# Patient Record
Sex: Female | Born: 1945
Health system: Southern US, Community
[De-identification: ages and names within clinical notes are randomized; demographics above are authoritative.]

## PROBLEM LIST (undated history)

## (undated) DIAGNOSIS — E7800 Pure hypercholesterolemia, unspecified: Secondary | ICD-10-CM

## (undated) DIAGNOSIS — I10 Essential (primary) hypertension: Secondary | ICD-10-CM

## (undated) DIAGNOSIS — I4891 Unspecified atrial fibrillation: Principal | ICD-10-CM

## (undated) DIAGNOSIS — I219 Acute myocardial infarction, unspecified: Secondary | ICD-10-CM

## (undated) DIAGNOSIS — Z7901 Long term (current) use of anticoagulants: Secondary | ICD-10-CM

## (undated) HISTORY — DX: Unspecified atrial fibrillation: I48.91

## (undated) HISTORY — PX: ECTOPIC PREGNANCY SURGERY: SHX613

## (undated) HISTORY — DX: Long term (current) use of anticoagulants: Z79.01

---

## 1997-03-16 ENCOUNTER — Encounter (INDEPENDENT_AMBULATORY_CARE_PROVIDER_SITE_OTHER): Payer: Self-pay | Admitting: *Deleted

## 1997-03-16 LAB — CONVERTED CEMR LAB

## 1997-05-19 ENCOUNTER — Encounter: Admission: RE | Admit: 1997-05-19 | Discharge: 1997-05-19 | Payer: Self-pay | Admitting: Obstetrics & Gynecology

## 1997-08-14 ENCOUNTER — Inpatient Hospital Stay (HOSPITAL_COMMUNITY): Admission: EM | Admit: 1997-08-14 | Discharge: 1997-08-19 | Payer: Self-pay | Admitting: Emergency Medicine

## 1997-09-15 ENCOUNTER — Encounter: Admission: RE | Admit: 1997-09-15 | Discharge: 1997-09-15 | Payer: Self-pay | Admitting: Family Medicine

## 1998-03-25 ENCOUNTER — Inpatient Hospital Stay (HOSPITAL_COMMUNITY): Admission: EM | Admit: 1998-03-25 | Discharge: 1998-03-26 | Payer: Self-pay | Admitting: Emergency Medicine

## 1998-03-25 ENCOUNTER — Encounter: Payer: Self-pay | Admitting: Emergency Medicine

## 1998-03-26 ENCOUNTER — Encounter: Payer: Self-pay | Admitting: Cardiology

## 1998-11-25 ENCOUNTER — Encounter: Admission: RE | Admit: 1998-11-25 | Discharge: 1998-11-25 | Payer: Self-pay | Admitting: Sports Medicine

## 1998-12-13 ENCOUNTER — Emergency Department (HOSPITAL_COMMUNITY): Admission: EM | Admit: 1998-12-13 | Discharge: 1998-12-13 | Payer: Self-pay | Admitting: Emergency Medicine

## 1998-12-13 ENCOUNTER — Encounter: Payer: Self-pay | Admitting: Emergency Medicine

## 1998-12-28 ENCOUNTER — Encounter: Admission: RE | Admit: 1998-12-28 | Discharge: 1998-12-28 | Payer: Self-pay | Admitting: Sports Medicine

## 1999-02-03 ENCOUNTER — Encounter: Admission: RE | Admit: 1999-02-03 | Discharge: 1999-02-03 | Payer: Self-pay | Admitting: Sports Medicine

## 2000-01-03 ENCOUNTER — Encounter: Admission: RE | Admit: 2000-01-03 | Discharge: 2000-01-03 | Payer: Self-pay | Admitting: Obstetrics & Gynecology

## 2000-01-12 ENCOUNTER — Encounter: Admission: RE | Admit: 2000-01-12 | Discharge: 2000-01-12 | Payer: Self-pay | Admitting: Sports Medicine

## 2000-01-17 ENCOUNTER — Encounter: Payer: Self-pay | Admitting: Obstetrics & Gynecology

## 2000-01-17 ENCOUNTER — Ambulatory Visit (HOSPITAL_COMMUNITY): Admission: RE | Admit: 2000-01-17 | Discharge: 2000-01-17 | Payer: Self-pay | Admitting: Obstetrics & Gynecology

## 2000-08-20 ENCOUNTER — Observation Stay (HOSPITAL_COMMUNITY): Admission: EM | Admit: 2000-08-20 | Discharge: 2000-08-21 | Payer: Self-pay | Admitting: Emergency Medicine

## 2000-08-20 ENCOUNTER — Encounter: Payer: Self-pay | Admitting: Emergency Medicine

## 2001-04-18 ENCOUNTER — Encounter: Admission: RE | Admit: 2001-04-18 | Discharge: 2001-04-18 | Payer: Self-pay | Admitting: Sports Medicine

## 2001-04-22 ENCOUNTER — Encounter: Admission: RE | Admit: 2001-04-22 | Discharge: 2001-04-22 | Payer: Self-pay | Admitting: Sports Medicine

## 2001-04-22 ENCOUNTER — Encounter: Payer: Self-pay | Admitting: Sports Medicine

## 2002-08-21 ENCOUNTER — Encounter: Payer: Self-pay | Admitting: Sports Medicine

## 2002-08-21 ENCOUNTER — Encounter: Admission: RE | Admit: 2002-08-21 | Discharge: 2002-08-21 | Payer: Self-pay | Admitting: Family Medicine

## 2002-09-22 ENCOUNTER — Encounter: Admission: RE | Admit: 2002-09-22 | Discharge: 2002-09-22 | Payer: Self-pay | Admitting: Family Medicine

## 2002-09-30 ENCOUNTER — Encounter: Payer: Self-pay | Admitting: Obstetrics & Gynecology

## 2002-09-30 ENCOUNTER — Ambulatory Visit (HOSPITAL_COMMUNITY): Admission: RE | Admit: 2002-09-30 | Discharge: 2002-09-30 | Payer: Self-pay | Admitting: Obstetrics & Gynecology

## 2003-11-19 ENCOUNTER — Ambulatory Visit: Payer: Self-pay | Admitting: Sports Medicine

## 2005-01-12 ENCOUNTER — Ambulatory Visit: Payer: Self-pay | Admitting: Sports Medicine

## 2005-05-25 ENCOUNTER — Encounter: Payer: Self-pay | Admitting: Sports Medicine

## 2006-04-12 DIAGNOSIS — E782 Mixed hyperlipidemia: Secondary | ICD-10-CM | POA: Insufficient documentation

## 2006-04-12 DIAGNOSIS — D509 Iron deficiency anemia, unspecified: Secondary | ICD-10-CM | POA: Insufficient documentation

## 2006-04-12 DIAGNOSIS — I2510 Atherosclerotic heart disease of native coronary artery without angina pectoris: Secondary | ICD-10-CM | POA: Insufficient documentation

## 2006-04-12 DIAGNOSIS — K2100 Gastro-esophageal reflux disease with esophagitis, without bleeding: Secondary | ICD-10-CM | POA: Insufficient documentation

## 2006-04-12 DIAGNOSIS — I10 Essential (primary) hypertension: Secondary | ICD-10-CM | POA: Insufficient documentation

## 2006-04-13 ENCOUNTER — Encounter (INDEPENDENT_AMBULATORY_CARE_PROVIDER_SITE_OTHER): Payer: Self-pay | Admitting: *Deleted

## 2007-03-25 ENCOUNTER — Ambulatory Visit (HOSPITAL_COMMUNITY): Admission: RE | Admit: 2007-03-25 | Discharge: 2007-03-25 | Payer: Self-pay | Admitting: Family Medicine

## 2007-03-25 ENCOUNTER — Ambulatory Visit: Payer: Self-pay | Admitting: Family Medicine

## 2007-03-25 DIAGNOSIS — R0789 Other chest pain: Secondary | ICD-10-CM | POA: Insufficient documentation

## 2007-03-26 ENCOUNTER — Encounter: Payer: Self-pay | Admitting: Sports Medicine

## 2007-04-02 ENCOUNTER — Emergency Department (HOSPITAL_COMMUNITY): Admission: EM | Admit: 2007-04-02 | Discharge: 2007-04-02 | Payer: Self-pay | Admitting: Emergency Medicine

## 2007-04-04 ENCOUNTER — Ambulatory Visit: Payer: Self-pay | Admitting: Sports Medicine

## 2007-04-04 DIAGNOSIS — G44219 Episodic tension-type headache, not intractable: Secondary | ICD-10-CM | POA: Insufficient documentation

## 2007-04-04 DIAGNOSIS — G44229 Chronic tension-type headache, not intractable: Secondary | ICD-10-CM | POA: Insufficient documentation

## 2007-04-04 DIAGNOSIS — R0989 Other specified symptoms and signs involving the circulatory and respiratory systems: Secondary | ICD-10-CM | POA: Insufficient documentation

## 2007-04-11 ENCOUNTER — Encounter: Payer: Self-pay | Admitting: Sports Medicine

## 2007-04-11 ENCOUNTER — Ambulatory Visit: Payer: Self-pay | Admitting: Vascular Surgery

## 2007-04-11 ENCOUNTER — Ambulatory Visit (HOSPITAL_COMMUNITY): Admission: RE | Admit: 2007-04-11 | Discharge: 2007-04-11 | Payer: Self-pay | Admitting: Sports Medicine

## 2007-05-09 ENCOUNTER — Ambulatory Visit: Payer: Self-pay | Admitting: Sports Medicine

## 2007-05-09 ENCOUNTER — Encounter: Payer: Self-pay | Admitting: *Deleted

## 2007-05-09 LAB — CONVERTED CEMR LAB
AST: 19 units/L (ref 0–37)
Alkaline Phosphatase: 85 units/L (ref 39–117)
BUN: 13 mg/dL (ref 6–23)
Creatinine, Ser: 0.94 mg/dL (ref 0.40–1.20)
HDL: 45 mg/dL (ref >39)
LDL Cholesterol: 94 mg/dL (ref 0–99)
Total CHOL/HDL Ratio: 3.5
VLDL: 19 mg/dL (ref 0–40)

## 2007-05-21 ENCOUNTER — Encounter: Payer: Self-pay | Admitting: Sports Medicine

## 2007-06-17 ENCOUNTER — Encounter: Payer: Self-pay | Admitting: Sports Medicine

## 2007-06-19 ENCOUNTER — Encounter: Payer: Self-pay | Admitting: Sports Medicine

## 2008-01-21 ENCOUNTER — Encounter: Payer: Self-pay | Admitting: Family Medicine

## 2008-01-29 ENCOUNTER — Encounter: Payer: Self-pay | Admitting: Family Medicine

## 2008-06-16 ENCOUNTER — Encounter: Payer: Self-pay | Admitting: Family Medicine

## 2008-08-18 ENCOUNTER — Other Ambulatory Visit: Admission: RE | Admit: 2008-08-18 | Discharge: 2008-08-18 | Payer: Self-pay | Admitting: Obstetrics and Gynecology

## 2010-07-01 NOTE — Discharge Summary (Signed)
[place]  Patient:    Kathy Montgomery, Kathy Montgomery                      MRN: [id number] Adm. Date:  [id number] Disc. Date: [id number] Attending:  McDiarmid, Leighton Roach. Dictator:   Arlis Porta, M.D. CC:         Sibyl Parr. Fields, M.D.  [facility]   Discharge Summary  DISCHARGE DIAGNOSES: 1. Chest pain. 2. Coronary artery disease. 3. Hypercholesterolemia. 4. Hypertension. 5. Gastroesophageal reflux disease.  PROCEDURES:  None.  CONSULTATIONS:  None.  HISTORY OF PRESENT ILLNESS:  Ms. Solarz is a 65 year old, African-American female who presented with a chief complaint of recurrent sensation that "food is stuck in her throat" that has been occurring over the last month.  She states that this occurs at least one time per day and feels like pressure sensation in her back.  She also reports an abrupt onset of shortness of breath every other night for the last two to three nights and starts to hyperventilate.  The patient also described one episode of a "jolt" of chest pain two days ago that last only two to three seconds and resolved completely on its own that was not associated with any shortness of breath, nausea, vomiting or diaphoresis.  The patient was extremely anxious and wanted to have more workup done considering her past medical history of previous heart trouble.  REVIEW OF SYSTEMS:  The patient had no fevers or chills, no nausea, vomiting, heartburn, melena, bright red blood per rectum or any dysuria.  The patient does, however, have two- to three-pillow orthopnea.  The patient also described some tingling in her fingertips about two months ago, however, denies having any anxiety or depression.  PHYSICAL EXAMINATION:  VITAL SIGNS:  Afebrile with vital signs stable with 100% oxygen saturation on room air.  LUNGS:  Distant breath sounds throughout, however, no crackles or wheezes were heard.  HEART:  The patients rhythm was regular  and no murmurs, rubs or gallops were heard.  The patient did have 1+ bilateral lower extremity edema.  The rest of her physical exam was normal.  LABORATORY DATA AND X-RAY FINDINGS:  CBC with white blood cell count 10.7, hemoglobin/hematocrit ratio was 12.9 to 37.6, platelets were 118.  The patients electrolyte panel was within normal limits.  The patient did have three sets of cardiac enzymes drawn which were all negative for troponin or CK-MB.  However, the patients CK was mildly elevated to 269 and 234, finally decreasing to 172 in the last set.  The patients EKG showed slight T wave flattening in leads V5 to V6, but no significant changes from her last EKG from March 26, 1998.  The patients chest x-ray showed signs of cardiomegaly, however, acute air space disease was noted.  HOSPITAL COURSE:  The patient was then admitted to telemetry to observe her for 23 hours and to rule out MI.  Since the patients enzymes were negative and the patient described no further chest pain or shortness of breath, it was decided that the patient was in stable condition to be discharged to home.  CONDITION ON DISCHARGE:  At the time of discharge, the patient was in stable condition denying any chest pain, shortness of breath, abdominal pain, any other symptoms or any recurrence of her symptoms.  DISPOSITION:  Discharged to home.  ACTIVITY:  The patient can resume activity as tolerated and may resume going back to work as tolerated.  The patient was also advised to exercise, starting slowly, around 10-15 minutes of walking and gradually increasing.  The patient was also advised to stop immediately with any signs or symptoms of chest pain or shortness of breath ensued.  DIET:  The patient was advised to adhere to a cardiac diet, specifically a low fat, low cholesterol, avoiding fried foods, avoiding red meats, avoiding salt diet.  Eat lots of fruits and vegetables.  DISCHARGE MEDICATIONS: 1.  Aspirin 325 mg p.o. q.d. 2. Dyazide one tablet p.o. q.d. 3. Tenormin 50 mg p.o. q.d. 4. Zocor 20 mg p.o. q.h.s. 5. Sublingual nitroglycerin 0.4 mg sublingual every five minutes for chest    pain with a maximum dose of three tablets.  FOLLOWUP:  The patient has an appointment to see cardiologist from the [facility] on July 26, at 9:45 a.m.  SPECIAL INSTRUCTIONS:  The patient was told to return to the hospital for worsening chest pain or shortness of breath.                               DD: 08/21/00 TD:  08/21/00 Job: [id number]

## 2010-07-01 NOTE — H&P (Signed)
NAME:  Kathy Montgomery, Kathy Montgomery NO.:  [id number]   MEDICAL RECORD NO.:  [id number]                  PATIENT TYPE:   LOCATION:                                       FACILITY:   PHYSICIAN:  Roseanna Rainbow, M.D.         DATE OF BIRTH:   DATE OF ADMISSION:  DATE OF DISCHARGE:                                HISTORY & PHYSICAL   CHIEF COMPLAINT:  The patient is a 65 year old African-American female with  a history of postmenopausal bleeding and an endometrial biopsy suspicious  for an endometrial polyp.   HISTORY OF PRESENT ILLNESS:  The patient presented approximately 1 year ago  with an episode of vaginal bleeding.  Workup at that point included removal  of an endocervical polyp in the office as well as an endometrial biopsy,  both with benign features.  However, on the endometrial biopsy there was  noted to be fragments of a benign endometrial polyp.  Pap smear at that time  was within normal limits.  A pelvic ultrasound demonstrated a myomatous  uterus approximately 14 cm in sagittal length.  At this point a D&C  hysteroscopy was recommended.  However, the patient failed to follow up.  Over the last year the patient has not noted any further abnormal uterine  bleeding.   PAST OBSTETRICAL AND GYNECOLOGICAL HISTORY:  Onset of menopausal symptoms  began in her early 28s.  Hormone replacement therapy is not being used.  She  has been pregnant one time and the pregnancy was an ectopic pregnancy.   PAST MEDICAL HISTORY:  1. Hypercholesterolemia.  2. Hypertension.  3. Iron deficiency anemia.  4. Heart disease.  5. Gastroesophageal reflux.   PAST SURGICAL HISTORY:  Cardiac catheterization in July 1999.   SOCIAL HISTORY:  She is divorced.  She is employed as a Clinical biochemist.  She previously smoked.  She does not give any significant history of alcohol  usage.   FAMILY HISTORY:  Remarkable for hypertension, myocardial infarction, adult-  onset  diabetes.   REVIEW OF SYSTEMS:  GU:  Please see the history of present illness.   MEDICATIONS:  Include aspirin, Dyazide, quinapril, Tenormin, Zocor.   ALLERGIES:  No known drug allergies.   PHYSICAL EXAMINATION:  VITAL SIGNS:  Height 5 feet 4 inches, weight 190  pounds.  Blood pressure 159/82, pulse 60, temperature 97.3.  GENERAL:  Well-developed, well-nourished African-American female, no  apparent distress.  LUNGS:  Clear to auscultation bilaterally.  HEART:  Regular rate and rhythm.  ABDOMEN:  Soft, nontender, no organomegaly.  PELVIC:  The external female genitalia are normal appearing.  On speculum  exam the vagina is clean.  On bimanual exam the uterus is approximately 10-  12 weeks aggregate size, irregular in contour.  Adnexa without masses or  tenderness.  EXTREMITIES:  No clubbing, cyanosis, or edema.  SKIN:  Without rash.   ASSESSMENT:  Rule out endometrial polyp.   PLAN:  The  planned procedure is a D&C hysteroscopy.  The risks and benefits  of surgery were reviewed with the patient.                                               Roseanna Rainbow, M.D.    Kathy Montgomery  D:  08/10/2003  T:  08/10/2003  Job:  [id number]

## 2010-07-01 NOTE — H&P (Signed)
[place]  Patient:    Kathy Montgomery, Kathy Montgomery                      MRN: [id number] Adm. Date:  [id number] Attending:  McDiarmid, Leighton Roach. Dictator:   Lucille Passy, M.D. CC:         Rollene Rotunda, M.D. [facility]  Karl B. Darrick Penna, M.D.  Roseanna Rainbow, M.D.   History and Physical  CHIEF COMPLAINT: Chest pain/shortness of breath.  HISTORY OF PRESENT ILLNESS: Kathy Montgomery is a 65 year old African-American female, who presents with a recurrent sensation over the last month that "food is stuck in my throat and is undigested."  This sensation occurs at least one time per day and feels like a pressure sensation in her back.  She also reports abrupt onset of shortness of breath every other night for two to three nights, which feels like she is suffocating and she starts to hyperventilate. She describes one episode of a "jolt" of chest pain two days ago which lasted two to three seconds and then resolved completely.  This episode was associated with shortness of breath but not with nausea or vomiting or diaphoresis.  The patient denies any recent chest pain except for this episode.  "I am very anxious to find out if I have more heart blockage, and I want to see it on a picture."  REVIEW OF SYSTEMS: CONSTITUTIONAL: Negative for fever or chills. CARDIOVASCULAR: Positive for increased lower extremity edema over the last two days.  RESPIRATORY: Positive for two to three pillow orthopnea.  Negative for dyspnea on exertion.  Positive for shortness of breath and the sensation of suffocating.  GI: Negative for nausea and vomiting.  No heartburn, no melena, no bright red blood per rectum.  SKIN: Negative.  NEUROLOGIC: Positive tingling in fingertips two hours ago.  PSYCHIATRIC: Negative for anxiety or depression.  MUSCULOSKELETAL: Negative for aches, negative for joint pain. EYES: Negative for blurred vision.  ENDOCRINE: Negative hematology, negative GU.   No dysuria.  ENT: Negative.  Of note, the patient does report travel to [place], [place] five days ago.  She had a four hour flight with a long layover.  She returned to [place], [place] three days ago.  PAST MEDICAL HISTORY:  1. Ectopic pregnancy.  2. Fibroids.  ALLERGIES: NKDA.  MEDICATIONS:  1. Aspirin 325 mg p.o. q.d.  2. Dyazide q.d.  3. Tenormin 50 mg p.o. q.d.  4. Zocor 20 mg p.o. q.h.s.  5. The patient was previously taking Prevacid and quinapril but she stopped     taking these four months ago, and she also has run out of her     nitroglycerin.  PROBLEM LIST:  1. Hyperlipidemia.  2. Anemia, iron deficiency.  3. Hypertension.  4. Atherosclerosis of coronary arteries.  5. Reflux esophagitis.  SOCIAL HISTORY: No alcohol use except occasional red wine.  Former smoker, stopped in 1999 and before that smoked one to two cigarettes per day for ten years.  The patient works as a Public relations account executive.  She walks one mile each week.  She has no children.  PREVIOUS PROCEDURES:  1. Cardiac catheterization with 100% blockage of branch PDA in July 1999.  2. Cardiolite, which was negative, in February 2000.  FAMILY HISTORY: Her father had type 2 diabetes and died of a diabetic come at age 40.  Her mother had obesity, hypertension, and MI in her 78s, asthma, and died of an upper respiratory  infection at age 16.  Her brother died of colon cancer at age 27.  PHYSICAL EXAMINATION:  VITAL SIGNS: Temperature 98.5 degrees, blood pressure 138/75, pulse 55, respiratory rate 20.  Oxygen saturation 100% on room air.  GENERAL: No apparent distress.  Alert and oriented x 3.  HEENT: Normal, with conjunctivae pink.  NECK: No lymphadenopathy.  RESPIRATORY: Distant breath sounds throughout, poor inspiratory effort; no crackles or wheezes.  CARDIOVASCULAR: No murmurs or rubs.  Regular rate and rhythm.  MUSCULOSKELETAL: Brisk capillary refill, 1+ bilateral lower extremity  edema, no cords felt in calves; negative Homans sign.  GI: No hepatosplenomegaly, no masses felt, abdomen soft, nontender, nondistended.  SKIN: Within normal limits.  NEUROLOGIC: Cranial nerves grossly intact.  RECTAL: The patient is guaiac positive.  No masses felt.  LABORATORY DATA: WBC 10.7, hemoglobin 12.9, platelets 118,000; neutrophils 53%, lymphocytes 37%.  An i-STAT showed sodium 142, potassium 3.4, chloride 105, bicarbonate 27, BUN 10, creatinine 1.2, glucose 83.  A pCO2 was 46.1, pH 7.373.  Cardiac enzymes show CK elevated at 269, MB 2.3, ratio I 0.9, troponin I 0.01.  EKG showed slight T wave flattening in leads V5 through V6, but no significant change from previous EKG dated March 26, 1998.  Chest x-ray showed cardiomegaly but no acute disease.  ASSESSMENT: This is a 65 year old African-American female with a history of coronary artery disease, status post myocardial infarction in 1999, now with atypical chest pain and shortness of breath.  Diagnosis could be myocardial infarction, pulmonary embolus, anxiety disorder/panic attack, esophageal spasm/esophagitis, gastrointestinal ulcer.  However, as troponin was normal and there are no electrocardiogram changes, cardiac etiology is unlikely. Anxiety component is most likely etiology.  Gastrointestinal etiology is unlikely as the patient is guaiac negative.  PLAN:  1. Admit patient to family practice teaching service on 23 hour observation     to rule out MI with cardiac enzymes.  Will consult cardiology if enzymes     are elevated.  Admit to telemetry.  Continue aspirin, beta-blocker,     Dyazide, Zocor, but will not give oxygen or nitroglycerin as the patient     is currently chest pain-free.  2. D-dimer study to rule out PE.  3. Risk stratification.  Will check fasting lipid profile.DD:  08/21/00 TD:  08/21/00  Job: [id number]

## 2010-11-04 LAB — URINE MICROSCOPIC-ADD ON

## 2010-11-04 LAB — URINALYSIS, ROUTINE W REFLEX MICROSCOPIC
Leukocytes, UA: NEGATIVE
Nitrite: NEGATIVE
Specific Gravity, Urine: 1.018
Urobilinogen, UA: 1

## 2010-11-04 LAB — I-STAT 8, (EC8 V) (CONVERTED LAB)
BUN: 17
Bicarbonate: 29.2 — ABNORMAL HIGH
Glucose, Bld: 184 — ABNORMAL HIGH
Hemoglobin: 16 — ABNORMAL HIGH
Sodium: 139

## 2010-11-04 LAB — PROTIME-INR: Prothrombin Time: 12.9

## 2010-11-04 LAB — SAMPLE TO BLOOD BANK

## 2010-11-04 LAB — CBC
MCV: 88.7
Platelets: 200
WBC: 10.7 — ABNORMAL HIGH

## 2011-03-27 ENCOUNTER — Other Ambulatory Visit: Payer: Self-pay | Admitting: Obstetrics and Gynecology

## 2011-03-27 ENCOUNTER — Other Ambulatory Visit (HOSPITAL_COMMUNITY)
Admission: RE | Admit: 2011-03-27 | Discharge: 2011-03-27 | Disposition: A | Discharge status: Home / Self Care | Payer: BC Managed Care – PPO | Source: Ambulatory Visit | Attending: Obstetrics and Gynecology | Admitting: Obstetrics and Gynecology

## 2011-03-27 DIAGNOSIS — Z01419 Encounter for gynecological examination (general) (routine) without abnormal findings: Secondary | ICD-10-CM | POA: Insufficient documentation

## 2011-03-27 DIAGNOSIS — N6320 Unspecified lump in the left breast, unspecified quadrant: Secondary | ICD-10-CM

## 2011-04-10 ENCOUNTER — Ambulatory Visit
Admission: RE | Admit: 2011-04-10 | Discharge: 2011-04-10 | Disposition: A | Discharge status: Home / Self Care | Payer: BC Managed Care – PPO | Source: Ambulatory Visit | Attending: Obstetrics and Gynecology | Admitting: Obstetrics and Gynecology

## 2011-04-10 DIAGNOSIS — N6320 Unspecified lump in the left breast, unspecified quadrant: Secondary | ICD-10-CM

## 2012-04-02 ENCOUNTER — Other Ambulatory Visit (HOSPITAL_COMMUNITY)
Admission: RE | Admit: 2012-04-02 | Discharge: 2012-04-02 | Disposition: A | Discharge status: Home / Self Care | Payer: PRIVATE HEALTH INSURANCE | Source: Ambulatory Visit | Attending: Obstetrics and Gynecology | Admitting: Obstetrics and Gynecology

## 2012-04-02 ENCOUNTER — Other Ambulatory Visit: Payer: Self-pay | Admitting: Obstetrics and Gynecology

## 2012-04-02 DIAGNOSIS — Z1151 Encounter for screening for human papillomavirus (HPV): Secondary | ICD-10-CM | POA: Insufficient documentation

## 2012-04-02 DIAGNOSIS — Z124 Encounter for screening for malignant neoplasm of cervix: Secondary | ICD-10-CM | POA: Insufficient documentation

## 2012-10-28 ENCOUNTER — Emergency Department (HOSPITAL_COMMUNITY): Payer: Medicare Other

## 2012-10-28 ENCOUNTER — Emergency Department (HOSPITAL_COMMUNITY)
Admission: EM | Admit: 2012-10-28 | Discharge: 2012-10-28 | Disposition: A | Discharge status: Home / Self Care | Payer: Medicare Other | Attending: Emergency Medicine | Admitting: Emergency Medicine

## 2012-10-28 ENCOUNTER — Encounter (HOSPITAL_COMMUNITY): Payer: Self-pay | Admitting: *Deleted

## 2012-10-28 DIAGNOSIS — D329 Benign neoplasm of meninges, unspecified: Secondary | ICD-10-CM

## 2012-10-28 DIAGNOSIS — R42 Dizziness and giddiness: Secondary | ICD-10-CM

## 2012-10-28 DIAGNOSIS — Z79899 Other long term (current) drug therapy: Secondary | ICD-10-CM | POA: Insufficient documentation

## 2012-10-28 DIAGNOSIS — E7800 Pure hypercholesterolemia, unspecified: Secondary | ICD-10-CM | POA: Insufficient documentation

## 2012-10-28 DIAGNOSIS — Z7982 Long term (current) use of aspirin: Secondary | ICD-10-CM | POA: Insufficient documentation

## 2012-10-28 DIAGNOSIS — I4891 Unspecified atrial fibrillation: Secondary | ICD-10-CM | POA: Insufficient documentation

## 2012-10-28 DIAGNOSIS — D320 Benign neoplasm of cerebral meninges: Secondary | ICD-10-CM | POA: Insufficient documentation

## 2012-10-28 DIAGNOSIS — I480 Paroxysmal atrial fibrillation: Secondary | ICD-10-CM

## 2012-10-28 DIAGNOSIS — I10 Essential (primary) hypertension: Secondary | ICD-10-CM | POA: Insufficient documentation

## 2012-10-28 DIAGNOSIS — I252 Old myocardial infarction: Secondary | ICD-10-CM | POA: Insufficient documentation

## 2012-10-28 HISTORY — DX: Essential (primary) hypertension: I10

## 2012-10-28 HISTORY — DX: Acute myocardial infarction, unspecified: I21.9

## 2012-10-28 HISTORY — DX: Pure hypercholesterolemia, unspecified: E78.00

## 2012-10-28 LAB — COMPREHENSIVE METABOLIC PANEL
ALT: 7 U/L (ref 0–35)
BUN: 16 mg/dL (ref 6–23)
CO2: 27 mEq/L (ref 19–32)
Calcium: 9.9 mg/dL (ref 8.4–10.5)
Creatinine, Ser: 1 mg/dL (ref 0.50–1.10)
GFR calc Af Amer: 67 mL/min — ABNORMAL LOW (ref >90)
GFR calc non Af Amer: 57 mL/min — ABNORMAL LOW (ref >90)
Glucose, Bld: 98 mg/dL (ref 70–99)
Sodium: 137 mEq/L (ref 135–145)
Total Protein: 6.8 g/dL (ref 6.0–8.3)

## 2012-10-28 LAB — DIFFERENTIAL
Eosinophils Absolute: 0.2 10*3/uL (ref 0.0–0.7)
Eosinophils Relative: 3 % (ref 0–5)
Lymphocytes Relative: 29 % (ref 12–46)
Lymphs Abs: 2.5 10*3/uL (ref 0.7–4.0)
Monocytes Absolute: 0.7 10*3/uL (ref 0.1–1.0)
Monocytes Relative: 9 % (ref 3–12)

## 2012-10-28 LAB — TROPONIN I: Troponin I: <0.3 ng/mL (ref <0.30)

## 2012-10-28 LAB — CBC
HCT: 44.1 % (ref 36.0–46.0)
Hemoglobin: 14.8 g/dL (ref 12.0–15.0)
MCH: 30.5 pg (ref 26.0–34.0)
MCV: 90.7 fL (ref 78.0–100.0)
Platelets: 169 10*3/uL (ref 150–400)
RBC: 4.86 MIL/uL (ref 3.87–5.11)
WBC: 8.7 10*3/uL (ref 4.0–10.5)

## 2012-10-28 LAB — PROTIME-INR: Prothrombin Time: 13.6 seconds (ref 11.6–15.2)

## 2012-10-28 MED ORDER — GADOBENATE DIMEGLUMINE 529 MG/ML IV SOLN
20.0000 mL | Freq: Once | INTRAVENOUS | Status: AC
  Administered 2012-10-28: 17 mL via INTRAVENOUS

## 2012-10-28 MED ORDER — MECLIZINE HCL 25 MG PO TABS: 25.0000 mg | ORAL_TABLET | Freq: Three times a day (TID) | ORAL | Status: DC | PRN

## 2012-10-28 MED ORDER — MECLIZINE HCL 25 MG PO TABS
25.0000 mg | ORAL_TABLET | Freq: Once | ORAL | Status: AC
  Administered 2012-10-28: 25 mg via ORAL
  Filled 2012-10-28: qty 1

## 2012-10-28 NOTE — ED Notes (Signed)
Feels right ear is clogged-- states "can't hear out if it same as left ear"

## 2012-10-28 NOTE — ED Notes (Signed)
Pt in AFib -- repeat EKG done, showed to Dr. Lynelle Doctor-- pt states has hx AFib

## 2012-10-28 NOTE — ED Notes (Signed)
Pt woke up feeling dizzy this am.  She has had an MI in the past and is worried it is the same, although her s/s were different.  Pt states she feels "wobbly" and that she had 1 episode of this last week.  Denies chest pain. No facial droop or extremity weakness.

## 2012-10-28 NOTE — ED Notes (Signed)
Ambulated to bathroom without dizziness or difficulty.

## 2012-10-28 NOTE — ED Provider Notes (Signed)
CSN: [id number]     Arrival date & time 10/28/12  4098 History  First MD Initiated Contact with Patient 10/28/12 [phone]     Chief Complaint  Patient presents with  . Dizziness    HPI The patient presents to the emergency department with complaints of dizziness that she noticed when she woke up this morning. Patient states it's primarily a sensation of unsteadiness. She does not feel that the room is spinning. She feels like her coordination is a little off although she has not fallen. The patient denies any focal numbness or weakness. She has not noticed any issues with her grip. Patient has not fallen.  Patient has noticed some sensation hearing.  Feels like she has some fullness on one side. Patient denies any chest pain or shortness of breath. She denies any sore throat. Patient has noted some sinus congestion as well. She is also concerned about the possibility of heart problems.  She has a history of myocardial infarction in the past her primary sensation was difficulty with swallowing and a sensation in her back. She's not having it presently but she is concerned because her heart attack symptoms were very ill-defined. Past Medical History  Diagnosis Date  . Hypertension   . MI (myocardial infarction)   . Hypercholesteremia    Past Surgical History  Procedure Laterality Date  . Ectopic pregnancy surgery     No family history on file. History  Substance Use Topics  . Smoking status: Not on file  . Smokeless tobacco: Not on file  . Alcohol Use: Not on file   OB History   Grav Para Term Preterm Abortions TAB SAB Ect Mult Living                 Review of Systems  All other systems reviewed and are negative.    Allergies  Review of patient's allergies indicates no known allergies.  Home Medications   Current Outpatient Rx  Name  Route  Sig  Dispense  Refill  . amLODipine (NORVASC) 5 MG tablet   Oral   Take 5 mg by mouth daily.         Marland Kitchen aspirin 81 MG EC tablet    Oral   Take 81 mg by mouth daily.           Marland Kitchen atorvastatin (LIPITOR) 40 MG tablet   Oral   Take 40 mg by mouth at bedtime.         Marland Kitchen lisinopril-hydrochlorothiazide (PRINZIDE,ZESTORETIC) 20-25 MG per tablet   Oral   Take 1 tablet by mouth daily.         . metoprolol (LOPRESSOR) 50 MG tablet   Oral   Take 50 mg by mouth at bedtime.          . meclizine (ANTIVERT) 25 MG tablet   Oral   Take 1 tablet (25 mg total) by mouth 3 (three) times daily as needed.   30 tablet   0    BP 136/85  Pulse 92  Temp(Src) 97.2 F (36.2 C) (Oral)  Resp 22  SpO2 99% Physical Exam  Nursing note and vitals reviewed. Constitutional: She is oriented to person, place, and time. She appears well-developed and well-nourished. No distress.  HENT:  Head: Normocephalic and atraumatic.  Right Ear: External ear normal.  Left Ear: External ear normal.  Mouth/Throat: Oropharynx is clear and moist.  Eyes: Conjunctivae are normal. Right eye exhibits no discharge. Left eye exhibits no discharge. No scleral icterus.  Neck: Neck supple. No tracheal deviation present.  Cardiovascular: Normal rate, regular rhythm and intact distal pulses.   Pulmonary/Chest: Effort normal and breath sounds normal. No stridor. No respiratory distress. She has no wheezes. She has no rales.  Abdominal: Soft. Bowel sounds are normal. She exhibits no distension. There is no tenderness. There is no rebound and no guarding.  Musculoskeletal: She exhibits no edema and no tenderness.  Neurological: She is alert and oriented to person, place, and time. She has normal strength. No cranial nerve deficit ( no gross defecits noted) or sensory deficit. She exhibits normal muscle tone. She displays no seizure activity. Coordination normal.  No pronator drift bilateral upper extrem, able to hold both legs off bed for 5 seconds, sensation intact in all extremities, no visual field cuts, no left or right sided neglect  Skin: Skin is warm and  dry. No rash noted.  Psychiatric: She has a normal mood and affect.    ED Course  Procedures (including critical care time) EKG Sinus rhythm rate 65 Left atrial enlargement Normal axis normal intervals Normal ST-T waves When compared to prior EKG dated 9 every 2009 lateral T wave changes have resolved Labs Review Labs Reviewed  COMPREHENSIVE METABOLIC PANEL - Abnormal; Notable for the following:    GFR calc non Af Amer 57 (*)    GFR calc Af Amer 67 (*)    All other components within normal limits  PROTIME-INR  APTT  CBC  DIFFERENTIAL  TROPONIN I   Imaging Review Dg Chest 2 View  10/28/2012   *RADIOLOGY REPORT*  Clinical Data: Dizziness, history of coronary artery disease  CHEST - 2 VIEW  Comparison: CT 04/02/2007, chest radiograph 04/02/2007  Findings: Heart size at upper limits of normal.  The lungs are clear.  No pleural effusion.  No acute osseous finding.  Mild bilateral AC joint degenerative change noted.  IMPRESSION: No acute cardiopulmonary process.   Original Report Authenticated By: Christiana Pellant, M.D.   Mr Laqueta Jean Wo Contrast  10/28/2012   CLINICAL DATA:  Acute onset of dizziness and unsteady gait.  EXAM: MRI HEAD WITHOUT AND WITH CONTRAST  TECHNIQUE: Multiplanar, multiecho pulse sequences of the brain and surrounding structures were obtained according to standard protocol without and with intravenous contrast  CONTRAST:  17mL MULTIHANCE GADOBENATE DIMEGLUMINE 529 MG/ML IV SOLN  COMPARISON:  None.  FINDINGS: Diffusion imaging does not show any acute or subacute infarction. The brainstem and cerebellum are normal. The cerebral hemispheres show mild chronic small vessel change of the white matter. There is a the meningioma of the greater wing of the sphenoid with some calcification measuring 2.8 x1.9 x 2.5 cm. This indents the brain slightly but is not associated with any vasogenic edema or shift. No 2nd lesion. No pituitary mass. No inflammatory sinus disease. There is a  retention cyst in the right maxillary sinus. No skull base lesion.  IMPRESSION: No acute finding. No cause of the describe symptoms is identified.  Mild chronic appearing small vessel change of the cerebral hemispheric white matter.  2.8 x 2.5 x 1.9 cm meningioma of the greater wing of the sphenoid bone on the left, likely incidental at this time.   Electronically Signed   By: Paulina Fusi M.D.   On: 10/28/2012 11:28   1130 patient was noted to have a regular rhythm on the heart monitor. EKG revealed atrial fibrillation. Patient remains rate control. She is asymptomatic. MDM   1. Dizziness   2. Paroxysmal atrial fibrillation  3. Meningioma    Patient does not have evidence of stroke. I doubt myocardial ischemia as a source of her symptoms. Patient was noted to have atrial fibrillation paroxysmally here in the emergency department.  It is possible this has been the source of her dizziness. She does remained rate controlled. Sure he takes a beta blocker. I discussed the case with her cardiologist, Dr. Katrinka Blazing. The patient is already on an aspirin daily. He plans on outpatient followup. His office will call to arrange for a Holter monitor as well as an echocardiogram.  I discussed the MRI findings with the patient in the incidental meningioma. She will just need routine serial evaluation of that lesion. It is possible her symptoms could be related to peripheral vertigo she has noticed some sinus congestion and ear symptoms. I will prescribe her meclizine empirically    Celene Kras, MD 10/28/12 [phone]

## 2012-10-28 NOTE — ED Notes (Signed)
Felt light headed when first woke up-- progressing to moderate dizziness, with balance "off" -- states was able to walk, but wasn't normal. States no difficulty with speech--

## 2012-10-29 ENCOUNTER — Other Ambulatory Visit (HOSPITAL_COMMUNITY): Payer: Self-pay | Admitting: Interventional Cardiology

## 2012-10-29 DIAGNOSIS — I4891 Unspecified atrial fibrillation: Secondary | ICD-10-CM

## 2012-11-01 ENCOUNTER — Ambulatory Visit (HOSPITAL_COMMUNITY)
Admission: RE | Admit: 2012-11-01 | Discharge: 2012-11-01 | Disposition: A | Discharge status: Home / Self Care | Payer: Medicare Other | Source: Ambulatory Visit | Attending: Interventional Cardiology | Admitting: Interventional Cardiology

## 2012-11-01 DIAGNOSIS — I359 Nonrheumatic aortic valve disorder, unspecified: Secondary | ICD-10-CM | POA: Insufficient documentation

## 2012-11-01 DIAGNOSIS — I4891 Unspecified atrial fibrillation: Secondary | ICD-10-CM | POA: Insufficient documentation

## 2012-11-01 DIAGNOSIS — I252 Old myocardial infarction: Secondary | ICD-10-CM | POA: Insufficient documentation

## 2012-11-01 DIAGNOSIS — I517 Cardiomegaly: Secondary | ICD-10-CM | POA: Insufficient documentation

## 2012-11-01 DIAGNOSIS — I2510 Atherosclerotic heart disease of native coronary artery without angina pectoris: Secondary | ICD-10-CM

## 2012-11-01 NOTE — Progress Notes (Signed)
Echocardiogram 2D Echocardiogram has been performed.  Kathy Montgomery 11/01/2012, 10:19 AM

## 2012-11-13 ENCOUNTER — Ambulatory Visit: Payer: Medicare Other | Admitting: Interventional Cardiology

## 2012-11-14 ENCOUNTER — Ambulatory Visit (INDEPENDENT_AMBULATORY_CARE_PROVIDER_SITE_OTHER): Payer: Medicare Other | Admitting: Pharmacist

## 2012-11-14 DIAGNOSIS — I482 Chronic atrial fibrillation, unspecified: Secondary | ICD-10-CM | POA: Insufficient documentation

## 2012-11-14 DIAGNOSIS — I4891 Unspecified atrial fibrillation: Secondary | ICD-10-CM

## 2012-11-14 HISTORY — DX: Unspecified atrial fibrillation: I48.91

## 2012-11-20 ENCOUNTER — Ambulatory Visit: Payer: Medicare Other | Admitting: Interventional Cardiology

## 2012-11-22 ENCOUNTER — Ambulatory Visit (INDEPENDENT_AMBULATORY_CARE_PROVIDER_SITE_OTHER): Payer: Medicare Other | Admitting: Pharmacist

## 2012-11-22 DIAGNOSIS — I4891 Unspecified atrial fibrillation: Secondary | ICD-10-CM

## 2012-11-22 LAB — POCT INR: INR: 3.3

## 2012-12-02 ENCOUNTER — Ambulatory Visit (INDEPENDENT_AMBULATORY_CARE_PROVIDER_SITE_OTHER): Payer: Medicare Other | Admitting: Pharmacist

## 2012-12-02 DIAGNOSIS — I4891 Unspecified atrial fibrillation: Secondary | ICD-10-CM

## 2012-12-04 ENCOUNTER — Ambulatory Visit: Payer: Medicare Other | Admitting: Interventional Cardiology

## 2012-12-11 ENCOUNTER — Ambulatory Visit: Payer: Medicare Other | Admitting: Interventional Cardiology

## 2012-12-13 ENCOUNTER — Ambulatory Visit: Payer: Medicare Other | Admitting: Interventional Cardiology

## 2012-12-17 ENCOUNTER — Ambulatory Visit (INDEPENDENT_AMBULATORY_CARE_PROVIDER_SITE_OTHER): Payer: Medicare Other | Admitting: Interventional Cardiology

## 2012-12-17 ENCOUNTER — Encounter: Payer: Self-pay | Admitting: Interventional Cardiology

## 2012-12-17 ENCOUNTER — Ambulatory Visit (INDEPENDENT_AMBULATORY_CARE_PROVIDER_SITE_OTHER): Payer: Medicare Other | Admitting: Pharmacist

## 2012-12-17 ENCOUNTER — Ambulatory Visit: Payer: Medicare Other | Admitting: Interventional Cardiology

## 2012-12-17 VITALS — BP 124/74 | HR 53 | Ht 63.0 in | Wt 189.0 lb

## 2012-12-17 DIAGNOSIS — I4891 Unspecified atrial fibrillation: Secondary | ICD-10-CM

## 2012-12-17 DIAGNOSIS — Z7901 Long term (current) use of anticoagulants: Secondary | ICD-10-CM

## 2012-12-17 DIAGNOSIS — I10 Essential (primary) hypertension: Secondary | ICD-10-CM

## 2012-12-17 DIAGNOSIS — E785 Hyperlipidemia, unspecified: Secondary | ICD-10-CM

## 2012-12-17 DIAGNOSIS — I2510 Atherosclerotic heart disease of native coronary artery without angina pectoris: Secondary | ICD-10-CM

## 2012-12-17 HISTORY — DX: Long term (current) use of anticoagulants: Z79.01

## 2012-12-17 LAB — POCT INR: INR: 2.3

## 2012-12-17 MED ORDER — WARFARIN SODIUM 5 MG PO TABS: 5.0000 mg | ORAL_TABLET | Freq: Every day | ORAL | Status: DC

## 2012-12-17 NOTE — Patient Instructions (Signed)
Your physician recommends that you continue on your current medications as directed. Please refer to the Current Medication list given to you today.  Your physician wants you to follow-up in: 6 months. You will receive a reminder letter in the mail two months in advance. If you don't receive a letter, please call our office to schedule the follow-up appointment.  

## 2012-12-17 NOTE — Progress Notes (Signed)
Patient ID: Kathy Montgomery, female   DOB: October 08, 1945, 67 y.o.   MRN: [id number]    [address] [place], [place]  [address] Phone: [phone] Fax:  [phone]  Date:  12/17/2012   ID:  Kathy Montgomery, DOB [birth year], MRN [id number]  PCP:  Katy Apo, MD   ASSESSMENT:  1. Paroxysmal atrial fibrillation 2. Hypertension 3. Hyperlipidemia 4. Coronary disease with bare-metal stent 1999  PLAN:  1. Continue to follow in the Coumadin clinic 2. Clinical followup in 2 months 3. Cautioned to call if palpitations or other symptoms.   SUBJECTIVE: Kathy Montgomery is a 67 y.o. female is doing well. Dramatic decrease in palpitations that occur with medication adjustments. She denies syncope, chest pain, orthopnea, PND, and peripheral edema. No medication side effects.   Wt Readings from Last 3 Encounters:  12/17/12 189 lb (85.73 kg)  05/09/07 187 lb (84.823 kg)  04/04/07 186 lb 12.8 oz (84.732 kg)    Past Medical History  Diagnosis Date  . Hypertension   . MI (myocardial infarction)   . Hypercholesteremia   . Atrial fibrillation 11/14/2012  . Long term (current) use of anticoagulants 12/17/2012    Atrial fibrillation     Current Outpatient Prescriptions on File Prior to Visit  Medication Sig Dispense Refill  . amLODipine (NORVASC) 5 MG tablet Take 5 mg by mouth daily.      Marland Kitchen aspirin 81 MG EC tablet Take 81 mg by mouth daily.        Marland Kitchen atorvastatin (LIPITOR) 40 MG tablet Take 40 mg by mouth at bedtime.      Marland Kitchen lisinopril-hydrochlorothiazide (PRINZIDE,ZESTORETIC) 20-25 MG per tablet Take 1 tablet by mouth daily.      . meclizine (ANTIVERT) 25 MG tablet Take 1 tablet (25 mg total) by mouth 3 (three) times daily as needed.  30 tablet  0  . metoprolol (LOPRESSOR) 50 MG tablet Take 50 mg by mouth at bedtime.       . Multiple Vitamins-Minerals (MULTIVITAMIN GUMMIES ADULTS) CHEW Chew by mouth daily.       No current facility-administered medications on file prior to  visit.     Allergies:   No Known Allergies  Social History:  The patient  reports that she quit smoking about 12 years ago. Her smoking use included Cigarettes. She smoked 0.00 packs per day. She does not have any smokeless tobacco history on file.   ROS:  Please see the history of present illness.   No bleeding, transient neurological symptoms, falls, or intestinal upset.   All other systems reviewed and negative.   OBJECTIVE: VS:  BP 124/74  Pulse 53  Ht 5\' 3"  (1.6 m)  Wt 189 lb (85.73 kg)  BMI 33.49 kg/m2 Well nourished, well developed, in no acute distress, younger than stated age HEENT: normal Neck: JVD flat. Carotid bruit absent  Cardiac:  normal S1, S2; RRR; no murmur Lungs:  clear to auscultation bilaterally, no wheezing, rhonchi or rales Abd: soft, nontender, no hepatomegaly Ext: Edema none. Pulses none Skin: warm and dry Neuro:  CNs 2-12 intact, no focal abnormalities noted  EKG:  Normal sinus rhythm with atrial abnormality and rightward axis      Signed, Darci Needle III, MD 12/17/2012 4:21 PM

## 2013-01-07 ENCOUNTER — Ambulatory Visit (INDEPENDENT_AMBULATORY_CARE_PROVIDER_SITE_OTHER): Payer: Medicare Other | Admitting: Pharmacist

## 2013-01-07 DIAGNOSIS — I4891 Unspecified atrial fibrillation: Secondary | ICD-10-CM

## 2013-01-07 LAB — POCT INR: INR: 2

## 2013-10-31 ENCOUNTER — Ambulatory Visit: Payer: Medicare Other | Admitting: Interventional Cardiology

## 2014-01-12 ENCOUNTER — Encounter: Payer: Self-pay | Admitting: Interventional Cardiology

## 2014-01-12 ENCOUNTER — Ambulatory Visit (INDEPENDENT_AMBULATORY_CARE_PROVIDER_SITE_OTHER): Payer: Medicare Other | Admitting: Interventional Cardiology

## 2014-01-12 VITALS — BP 124/96 | HR 56 | Ht 63.0 in | Wt 195.8 lb

## 2014-01-12 DIAGNOSIS — R0683 Snoring: Secondary | ICD-10-CM

## 2014-01-12 DIAGNOSIS — I2510 Atherosclerotic heart disease of native coronary artery without angina pectoris: Secondary | ICD-10-CM

## 2014-01-12 DIAGNOSIS — I480 Paroxysmal atrial fibrillation: Secondary | ICD-10-CM

## 2014-01-12 DIAGNOSIS — I10 Essential (primary) hypertension: Secondary | ICD-10-CM

## 2014-01-12 DIAGNOSIS — Z7901 Long term (current) use of anticoagulants: Secondary | ICD-10-CM

## 2014-01-12 NOTE — Progress Notes (Signed)
Patient ID: Kathy Montgomery, female   DOB: 1945/10/04, 68 y.o.   MRN: [id number]    [address], [address] [place], [place]  [address] Phone: [phone] Fax:  [phone]  Date:  01/12/2014   ID:  Kathy Montgomery, DOB 08-04-1945, MRN [id number]  PCP:  Kandice Hams, MD   ASSESSMENT:  1. Paroxysmal atrial fibrillation and flutter 2. Chronic anticoagulation, discontinued by patient 3. Hypertension 4. Snoring and hypersomnolence related to possible sleep apnea   PLAN:  1. 24 Holter monitor 2. Sleep study 3. Resume Coumadin 4. Clinical follow-up in 6 weeks   SUBJECTIVE: Kathy Montgomery is a 68 y.o. female was having palpitations. Recently seen at [facility] primary care and referred back for reconsideration of management for atrial fibrillation. Patient has been on chronic anticoagulation therapy but discontinued this treatment on her on various reasons. Those no bleeding or complication. She was recently being evaluated for vertigo was found to have paroxysmal episodes of rapid heart rate. She was referred back for further evaluation. Today at the office visit she was in sinus rhythm but was having frequent episodes of nonsustained atrial fibrillation and atrial flutter.   Wt Readings from Last 3 Encounters:  01/12/14 195 lb 12.8 oz (88.814 kg)  12/17/12 189 lb (85.73 kg)  05/09/07 187 lb (84.823 kg)     Past Medical History  Diagnosis Date  . Hypertension   . MI (myocardial infarction)   . Hypercholesteremia   . Atrial fibrillation 11/14/2012  . Long term (current) use of anticoagulants 12/17/2012    Atrial fibrillation     Current Outpatient Prescriptions  Medication Sig Dispense Refill  . amLODipine (NORVASC) 5 MG tablet Take 5 mg by mouth daily.    Marland Kitchen atorvastatin (LIPITOR) 40 MG tablet Take 40 mg by mouth at bedtime.    Marland Kitchen lisinopril-hydrochlorothiazide (PRINZIDE,ZESTORETIC) 20-25 MG per tablet Take 1 tablet by mouth daily.    . metoprolol (LOPRESSOR) 50 MG tablet  Take 50 mg by mouth at bedtime.     . Multiple Vitamins-Minerals (MULTIVITAMIN GUMMIES ADULTS) CHEW Chew by mouth daily.    Marland Kitchen warfarin (COUMADIN) 5 MG tablet Take 1 tablet (5 mg total) by mouth daily. Take 1 tablet daily except Wed and Fri take 1/2 tablet    . aspirin 81 MG EC tablet Take 81 mg by mouth daily.      . meclizine (ANTIVERT) 25 MG tablet Take 1 tablet (25 mg total) by mouth 3 (three) times daily as needed. (Patient not taking: Reported on 01/12/2014) 30 tablet 0   No current facility-administered medications for this visit.    Allergies:   No Known Allergies  Social History:  The patient  reports that she quit smoking about 13 years ago. Her smoking use included Cigarettes. She smoked 0.00 packs per day. She does not have any smokeless tobacco history on file.   ROS:  Please see the history of present illness.  When a long discussion concerning medication compliance. She denies orthopnea, PND, wheezing, chest pain, and prolonged palpitations. No neurological complaints. No edema. No orthopnea.   All other systems reviewed and negative.   OBJECTIVE: VS:  BP 124/96 mmHg  Pulse 56  Ht 5\' 3"  (1.6 m)  Wt 195 lb 12.8 oz (88.814 kg)  BMI 34.69 kg/m2 Well nourished, well developed, in no acute distress, moderate obesity HEENT: normal Neck: JVD flat. Carotid bruit absent  Cardiac:  normal S1, S2; rapid and irregular RR; no murmur Lungs:  clear  to auscultation bilaterally, no wheezing, rhonchi or rales Abd: soft, nontender, no hepatomegaly Ext: Edema absent. Pulses 2+ and symmetric Skin: warm and dry Neuro:  CNs 2-12 intact, no focal abnormalities noted  EKG:  Sinus bradycardia at 56 bpm and otherwise normal with the exception of left atrial abnormality. Rhythm strip demonstrates atrial flutter with variable AV block and an average heart rate of 110 bpm       Signed, Illene Labrador III, MD 01/12/2014 12:15 PM

## 2014-01-12 NOTE — Patient Instructions (Signed)
Your physician has recommended you make the following change in your medication:  1) CHANGE Metoprolol to 25mg  twice daily. Take all other medications as prescribed  Keep your appointment with the coumadin clinic as planned  Your physician has recommended that you wear a holter monitor. Holter monitors are medical devices that record the heart's electrical activity. Doctors most often use these monitors to diagnose arrhythmias. Arrhythmias are problems with the speed or rhythm of the heartbeat. The monitor is a small, portable device. You can wear one while you do your normal daily activities. This is usually used to diagnose what is causing palpitations/syncope (passing out).  Your physician has recommended that you have a sleep study. This test records several body functions during sleep, including: brain activity, eye movement, oxygen and carbon dioxide blood levels, heart rate and rhythm, breathing rate and rhythm, the flow of air through your mouth and nose, snoring, body muscle movements, and chest and belly movement.  Your physician recommends that you schedule a follow-up appointment in: 6 weeks with Dr.Smith

## 2014-01-14 ENCOUNTER — Encounter (INDEPENDENT_AMBULATORY_CARE_PROVIDER_SITE_OTHER): Payer: Medicare Other

## 2014-01-14 ENCOUNTER — Ambulatory Visit (INDEPENDENT_AMBULATORY_CARE_PROVIDER_SITE_OTHER): Payer: Medicare Other | Admitting: Pharmacist

## 2014-01-14 ENCOUNTER — Encounter: Payer: Self-pay | Admitting: *Deleted

## 2014-01-14 DIAGNOSIS — I480 Paroxysmal atrial fibrillation: Secondary | ICD-10-CM

## 2014-01-14 DIAGNOSIS — I4891 Unspecified atrial fibrillation: Secondary | ICD-10-CM

## 2014-01-14 LAB — POCT INR: INR: 1.1

## 2014-01-14 MED ORDER — WARFARIN SODIUM 5 MG PO TABS: ORAL_TABLET | ORAL | Status: DC

## 2014-01-14 NOTE — Patient Instructions (Signed)

## 2014-01-14 NOTE — Progress Notes (Signed)
Patient ID: Kathy Montgomery, female   DOB: [birth year], 68 y.o.   MRN: [id number] [facility] 24 hour holter monitor applied to patient.

## 2014-01-21 ENCOUNTER — Ambulatory Visit (INDEPENDENT_AMBULATORY_CARE_PROVIDER_SITE_OTHER): Payer: Medicare Other | Admitting: *Deleted

## 2014-01-21 DIAGNOSIS — I480 Paroxysmal atrial fibrillation: Secondary | ICD-10-CM

## 2014-01-21 LAB — POCT INR: INR: 1.9

## 2014-01-26 ENCOUNTER — Telehealth: Payer: Self-pay | Admitting: Interventional Cardiology

## 2014-01-26 NOTE — Telephone Encounter (Signed)
New Msg ° ° ° ° °Pt returning call.  ° ° °

## 2014-01-27 NOTE — Telephone Encounter (Signed)
returned pt call.lmtcb if assistance is still needed. Not sure who called pt originally

## 2014-01-28 NOTE — Telephone Encounter (Signed)
Pt aware of holter monitor results -Sinus Node dysfunction with occasional Afib -STOP Metoprolol Pt verbalized understanding  Pt aware of follow up appt schedule with Dr.Smith on 03/11/14 @ 9:30

## 2014-01-29 ENCOUNTER — Ambulatory Visit (INDEPENDENT_AMBULATORY_CARE_PROVIDER_SITE_OTHER): Payer: Medicare Other | Admitting: *Deleted

## 2014-01-29 DIAGNOSIS — I480 Paroxysmal atrial fibrillation: Secondary | ICD-10-CM

## 2014-01-29 LAB — POCT INR: INR: 3.6

## 2014-02-10 ENCOUNTER — Ambulatory Visit (INDEPENDENT_AMBULATORY_CARE_PROVIDER_SITE_OTHER): Payer: Medicare Other | Admitting: *Deleted

## 2014-02-10 DIAGNOSIS — I480 Paroxysmal atrial fibrillation: Secondary | ICD-10-CM

## 2014-02-10 LAB — POCT INR: INR: 2.7

## 2014-02-19 ENCOUNTER — Ambulatory Visit (INDEPENDENT_AMBULATORY_CARE_PROVIDER_SITE_OTHER): Payer: Medicare Other | Admitting: *Deleted

## 2014-02-19 DIAGNOSIS — I480 Paroxysmal atrial fibrillation: Secondary | ICD-10-CM

## 2014-02-19 LAB — POCT INR: INR: 2.1

## 2014-03-11 ENCOUNTER — Ambulatory Visit (INDEPENDENT_AMBULATORY_CARE_PROVIDER_SITE_OTHER): Payer: Medicare Other | Admitting: Interventional Cardiology

## 2014-03-11 ENCOUNTER — Ambulatory Visit (INDEPENDENT_AMBULATORY_CARE_PROVIDER_SITE_OTHER): Payer: Medicare Other | Admitting: *Deleted

## 2014-03-11 ENCOUNTER — Encounter: Payer: Self-pay | Admitting: Interventional Cardiology

## 2014-03-11 VITALS — BP 140/74 | HR 55 | Ht 63.0 in | Wt 198.4 lb

## 2014-03-11 DIAGNOSIS — I480 Paroxysmal atrial fibrillation: Secondary | ICD-10-CM

## 2014-03-11 DIAGNOSIS — E785 Hyperlipidemia, unspecified: Secondary | ICD-10-CM

## 2014-03-11 DIAGNOSIS — Z7901 Long term (current) use of anticoagulants: Secondary | ICD-10-CM

## 2014-03-11 DIAGNOSIS — I2510 Atherosclerotic heart disease of native coronary artery without angina pectoris: Secondary | ICD-10-CM

## 2014-03-11 DIAGNOSIS — I10 Essential (primary) hypertension: Secondary | ICD-10-CM

## 2014-03-11 LAB — POCT INR: INR: 2.2

## 2014-03-11 NOTE — Progress Notes (Signed)
Patient ID: Kathy Montgomery, female   DOB: [birth year], 69 y.o.   MRN: [id number]    Cardiology Office Note   Date:  03/11/2014   ID:  Kathy Montgomery, DOB Jun 07, 1945, MRN [id number]  PCP:  Kandice Hams, MD  Cardiologist:   Sinclair Grooms, MD   PAF and CAD    History of Present Illness: Kathy Montgomery is a 69 y.o. female who presents for follow-up of paroxysmal atrial fibrillation. The A. fib had nocturnal occurrence. She is totally asymptomatic with reference to A. fib. She is awaiting a sleep study to exclude sleep apnea. She is tolerating her current medical regimen without difficulty. Because of an elevated chads score she is on Coumadin. No bleeding, location is on Coumadin.    Past Medical History  Diagnosis Date  . Hypertension   . MI (myocardial infarction)   . Hypercholesteremia   . Atrial fibrillation 11/14/2012  . Long term (current) use of anticoagulants 12/17/2012    Atrial fibrillation     Past Surgical History  Procedure Laterality Date  . Ectopic pregnancy surgery       Current Outpatient Prescriptions  Medication Sig Dispense Refill  . amLODipine (NORVASC) 5 MG tablet Take 5 mg by mouth daily.    Marland Kitchen atorvastatin (LIPITOR) 40 MG tablet Take 40 mg by mouth at bedtime.    Marland Kitchen losartan-hydrochlorothiazide (HYZAAR) 50-12.5 MG per tablet Take by mouth daily.    . Multiple Vitamins-Minerals (MULTIVITAMIN GUMMIES ADULTS) CHEW Chew by mouth daily.    Marland Kitchen warfarin (COUMADIN) 5 MG tablet Take as directed by the Coumadin Clinic. 60 tablet 1   No current facility-administered medications for this visit.    Allergies:   Review of patient's allergies indicates no known allergies.    Social History:  The patient  reports that she quit smoking about 13 years ago. Her smoking use included Cigarettes. She does not have any smokeless tobacco history on file.   Family History:  The patient's is positive for diabetes mellitus visit family history of stroke.     ROS:   Please see the history of present illness..   All other systems are reviewed and negative.    PHYSICAL EXAM: VS:  BP 140/74 mmHg  Pulse 55  Ht 5\' 3"  (1.6 m)  Wt 198 lb 6.4 oz (89.994 kg)  BMI 35.15 kg/m2  SpO2 99% , BMI Body mass index is 35.15 kg/(m^2). GEN: Well nourished, well developed, in no acute distress HEENT: normal Neck: no JVD, carotid bruits, or masses Cardiac: RRR; no murmurs, rubs, or gallops,no edema  Respiratory:  clear to auscultation bilaterally, normal work of breathing GI: soft, nontender, nondistended, + BS MS: no deformity or atrophy Skin: warm and dry, no rash Neuro:  Strength and sensation are intact Psych: euthymic mood, full affect   EKG:  EKG is not ordered today.    Recent Labs: No results found for requested labs within last 365 days.    Lipid Panel    Component Value Date/Time   CHOL 158 05/09/2007 2043   TRIG 97 05/09/2007 2043   HDL 45 05/09/2007 2043   CHOLHDL 3.5 Ratio 05/09/2007 2043   VLDL 19 05/09/2007 2043   LDLCALC 94 05/09/2007 2043      Wt Readings from Last 3 Encounters:  03/11/14 198 lb 6.4 oz (89.994 kg)  01/12/14 195 lb 12.8 oz (88.814 kg)  12/17/12 189 lb (85.73 kg)      Other studies Reviewed: Additional studies/ records that were  reviewed today include: None    ASSESSMENT AND PLAN:  1.  Nocturnal paroxysmal atrial fibrillation. Sleep apnea is in the process of being excluded 2. Essential hypertension controlled 3. Chronic Coumadin therapy 4. Remote history of myocardial infarction with out significant coronary obstructive disease  Current medicines are reviewed at length with the patient today.  The patient does not have concerns regarding medicines.  The following changes have been made:  Await results of sleep study. Sleep study is positive it may be that management will prevent A. fib and Coumadin therapy can be discontinued.   Labs/ tests ordered today include: none  No orders of the defined types  were placed in this encounter.     Disposition:   FU with  Linard Millers in 1 Year   Signed, Sinclair Grooms, MD  03/11/2014 10:04 AM    [address], [place], [place]  [address] Phone: [phone]; Fax: [phone]

## 2014-03-11 NOTE — Patient Instructions (Signed)
Your physician recommends that you continue on your current medications as directed. Please refer to the Current Medication list given to you today.  We will call your with the results of your sleep study  Your physician wants you to follow-up in: 1 year with Dr.Smith You will receive a reminder letter in the mail two months in advance. If you don't receive a letter, please call our office to schedule the follow-up appointment.

## 2014-03-31 ENCOUNTER — Encounter (HOSPITAL_BASED_OUTPATIENT_CLINIC_OR_DEPARTMENT_OTHER): Payer: Medicare Other

## 2014-04-09 ENCOUNTER — Ambulatory Visit (INDEPENDENT_AMBULATORY_CARE_PROVIDER_SITE_OTHER): Payer: Medicare Other

## 2014-04-09 DIAGNOSIS — I480 Paroxysmal atrial fibrillation: Secondary | ICD-10-CM

## 2014-04-09 DIAGNOSIS — I4891 Unspecified atrial fibrillation: Secondary | ICD-10-CM

## 2014-04-09 LAB — POCT INR: INR: 2.1

## 2014-04-17 ENCOUNTER — Telehealth: Payer: Self-pay

## 2014-04-17 ENCOUNTER — Other Ambulatory Visit: Payer: Self-pay | Admitting: Internal Medicine

## 2014-04-17 DIAGNOSIS — M542 Cervicalgia: Secondary | ICD-10-CM

## 2014-04-17 NOTE — Telephone Encounter (Signed)
Cardiac clearance placed in nurse fax box in MR to be faxed to [place] medical attn: Dr.Mann

## 2014-04-20 ENCOUNTER — Ambulatory Visit
Admission: RE | Admit: 2014-04-20 | Discharge: 2014-04-20 | Disposition: A | Discharge status: Home / Self Care | Payer: Medicare Other | Source: Ambulatory Visit | Attending: Internal Medicine | Admitting: Internal Medicine

## 2014-04-20 DIAGNOSIS — M542 Cervicalgia: Secondary | ICD-10-CM

## 2014-04-22 ENCOUNTER — Other Ambulatory Visit: Payer: Self-pay | Admitting: Internal Medicine

## 2014-04-22 DIAGNOSIS — E041 Nontoxic single thyroid nodule: Secondary | ICD-10-CM

## 2014-05-11 ENCOUNTER — Ambulatory Visit (HOSPITAL_BASED_OUTPATIENT_CLINIC_OR_DEPARTMENT_OTHER): Payer: Medicare Other | Attending: Cardiology

## 2014-05-11 VITALS — Ht 63.0 in | Wt 206.0 lb

## 2014-05-11 DIAGNOSIS — R0683 Snoring: Secondary | ICD-10-CM | POA: Diagnosis present

## 2014-05-11 DIAGNOSIS — G4719 Other hypersomnia: Secondary | ICD-10-CM

## 2014-05-13 ENCOUNTER — Telehealth: Payer: Self-pay | Admitting: Cardiology

## 2014-05-13 DIAGNOSIS — G4719 Other hypersomnia: Secondary | ICD-10-CM | POA: Insufficient documentation

## 2014-05-13 NOTE — Sleep Study (Signed)
   NAME: Kathy Montgomery DATE OF BIRTH:  10/14/1945 MEDICAL RECORD NUMBER [id number]  LOCATION: [facility]  PHYSICIAN: TURNER,TRACI R  DATE OF STUDY: 05/11/2014  SLEEP STUDY TYPE: Nocturnal Polysomnogram               REFERRING PHYSICIAN: Sueanne Margarita, MD  INDICATION FOR STUDY: Snoring and excessive daytime sleepiness  EPWORTH SLEEPINESS SCORE: 15 HEIGHT: 5\' 3"  (160 cm)  WEIGHT: 206 lb (93.441 kg)    Body mass index is 36.5 kg/(m^2).  NECK SIZE: 14 in.  MEDICATIONS: Reviewed in the chart  SLEEP ARCHITECTURE: The patient slept for a total of 295 minutes out of a total sleep period of 342 minutes.  There was no slow wave sleep and 76 minutes of REM sleep.  The onset to sleep latency was prolonged at 30 minutes and the onset to REM sleep was shortened at 66 minutes.  The sleep efficiency was reduced at 80%.  There were an increased number of arousals mainly due to spontaneous arousals with an arousal index of 8.9 per hour.    RESPIRATORY DATA: There were 6 obstructive apneas and 16 hypopneas for an AHI of 4.5 events per hour which is within normal range.  The RDI was normal at 4.5 events per hour.  Most events occurred in REM sleep with equal distribution between supine and nonsupine positions.  There was mild snoring noted.    OXYGEN DATA: The average oxygen saturation was 94%.  The lowest oxygen saturation was 86% and time spent with oxygen saturations below 88% was 0.3 minutes.  CARDIAC DATA: The patient maintained sinus bradycardia to NSR with frequent PAC's.  The average heart rate was 57 bpm.  The minimum heart rate was 30 bpm and the maximum heart rate was 171 bpm.    MOVEMENT/PARASOMNIA: There were no periodic limb movements or REM sleep behavior disorders noted.  IMPRESSION/ RECOMMENDATION:   1.  No evidence of significant sleep disordered breathing.  Overall AHI was 4.5 events per hour. 2.  Minimal snoring was noted during sleep. 3.  Reduced sleep  efficiency with increased frequency of arousals due to spontaneous arousals. 4.  Abnormal sleep architecture with no slow wave sleep. 5.  Frequent PAC's were noted during the study. 6.  The patient should be counseled in good sleep hygiene and weight loss.   7.  No indication for CPAP therapy at this time.   Signed: Sueanne Margarita Diplomate, American Board of Sleep Medicine  ELECTRONICALLY SIGNED ON:  05/13/2014, 4:41 PM [place] PH: [phone]   FX: [phone] [place]

## 2014-05-13 NOTE — Telephone Encounter (Signed)
Please let patient know that she does not have any evidence of sleep apnea

## 2014-05-15 ENCOUNTER — Telehealth: Payer: Self-pay | Admitting: *Deleted

## 2014-05-18 ENCOUNTER — Ambulatory Visit (INDEPENDENT_AMBULATORY_CARE_PROVIDER_SITE_OTHER): Payer: Medicare Other

## 2014-05-18 DIAGNOSIS — I480 Paroxysmal atrial fibrillation: Secondary | ICD-10-CM

## 2014-05-18 DIAGNOSIS — I4891 Unspecified atrial fibrillation: Secondary | ICD-10-CM | POA: Diagnosis not present

## 2014-05-18 LAB — POCT INR: INR: 1.3

## 2014-05-19 ENCOUNTER — Ambulatory Visit
Admission: RE | Admit: 2014-05-19 | Discharge: 2014-05-19 | Disposition: A | Discharge status: Home / Self Care | Payer: Medicare Other | Source: Ambulatory Visit | Attending: Internal Medicine | Admitting: Internal Medicine

## 2014-05-19 ENCOUNTER — Other Ambulatory Visit (HOSPITAL_COMMUNITY)
Admission: RE | Admit: 2014-05-19 | Discharge: 2014-05-19 | Disposition: A | Discharge status: Home / Self Care | Payer: Medicare Other | Source: Ambulatory Visit | Attending: Interventional Radiology | Admitting: Interventional Radiology

## 2014-05-19 DIAGNOSIS — E041 Nontoxic single thyroid nodule: Secondary | ICD-10-CM | POA: Insufficient documentation

## 2014-05-20 NOTE — Telephone Encounter (Signed)
Pt is aware of results. She requested that the notes stating that she does not have OSA be sent to Dr. Daneen Schick as well as her PCP Dr. Delfina Redwood.

## 2014-05-20 NOTE — Telephone Encounter (Signed)
See phone note below.  

## 2014-05-20 NOTE — Telephone Encounter (Signed)
I have left a voicemail on both numbers for Kathy Montgomery to call me back.

## 2014-05-30 ENCOUNTER — Encounter (HOSPITAL_COMMUNITY): Payer: Self-pay | Admitting: Emergency Medicine

## 2014-05-30 ENCOUNTER — Emergency Department (HOSPITAL_COMMUNITY): Payer: Medicare Other

## 2014-05-30 ENCOUNTER — Emergency Department (HOSPITAL_COMMUNITY)
Admission: EM | Admit: 2014-05-30 | Discharge: 2014-05-30 | Disposition: A | Discharge status: Home / Self Care | Payer: Medicare Other | Attending: Emergency Medicine | Admitting: Emergency Medicine

## 2014-05-30 DIAGNOSIS — R609 Edema, unspecified: Secondary | ICD-10-CM | POA: Insufficient documentation

## 2014-05-30 DIAGNOSIS — I252 Old myocardial infarction: Secondary | ICD-10-CM | POA: Insufficient documentation

## 2014-05-30 DIAGNOSIS — M79675 Pain in left toe(s): Secondary | ICD-10-CM | POA: Diagnosis present

## 2014-05-30 DIAGNOSIS — I10 Essential (primary) hypertension: Secondary | ICD-10-CM | POA: Insufficient documentation

## 2014-05-30 DIAGNOSIS — E876 Hypokalemia: Secondary | ICD-10-CM | POA: Insufficient documentation

## 2014-05-30 DIAGNOSIS — R600 Localized edema: Secondary | ICD-10-CM

## 2014-05-30 DIAGNOSIS — Z87891 Personal history of nicotine dependence: Secondary | ICD-10-CM | POA: Insufficient documentation

## 2014-05-30 DIAGNOSIS — Z79899 Other long term (current) drug therapy: Secondary | ICD-10-CM | POA: Diagnosis not present

## 2014-05-30 DIAGNOSIS — E780 Pure hypercholesterolemia: Secondary | ICD-10-CM | POA: Insufficient documentation

## 2014-05-30 DIAGNOSIS — Z7901 Long term (current) use of anticoagulants: Secondary | ICD-10-CM | POA: Diagnosis not present

## 2014-05-30 LAB — I-STAT CHEM 8, ED
BUN: 15 mg/dL (ref 6–23)
CALCIUM ION: 1.34 mmol/L — AB (ref 1.13–1.30)
Chloride: 99 mmol/L (ref 96–112)
Creatinine, Ser: 1 mg/dL (ref 0.50–1.10)
GLUCOSE: 166 mg/dL — AB (ref 70–99)
HCT: 46 % (ref 36.0–46.0)
Hemoglobin: 15.6 g/dL — ABNORMAL HIGH (ref 12.0–15.0)
Potassium: 3.1 mmol/L — ABNORMAL LOW (ref 3.5–5.1)
Sodium: 140 mmol/L (ref 135–145)
TCO2: 24 mmol/L (ref 0–100)

## 2014-05-30 LAB — PROTIME-INR
INR: 1.12 (ref 0.00–1.49)
Prothrombin Time: 14.5 seconds (ref 11.6–15.2)

## 2014-05-30 MED ORDER — POTASSIUM CHLORIDE CRYS ER 20 MEQ PO TBCR
40.0000 meq | EXTENDED_RELEASE_TABLET | Freq: Once | ORAL | Status: AC
  Administered 2014-05-30: 40 meq via ORAL
  Filled 2014-05-30: qty 2

## 2014-05-30 MED ORDER — FUROSEMIDE 20 MG PO TABS
20.0000 mg | ORAL_TABLET | Freq: Once | ORAL | Status: AC
  Administered 2014-05-30: 20 mg via ORAL
  Filled 2014-05-30: qty 1

## 2014-05-30 NOTE — ED Notes (Signed)
Pt c/o pain and swelling to left foot/toes, left thumb, and right toes since Tuesday.  Pt has swelling to B/L ankles.

## 2014-05-30 NOTE — Discharge Instructions (Signed)

## 2014-05-30 NOTE — ED Notes (Signed)
Patient returned from X-ray 

## 2014-05-30 NOTE — ED Provider Notes (Signed)
CSN: [id number]     Arrival date & time 05/30/14  74 History   First MD Initiated Contact with Patient 05/30/14 1640     Chief Complaint  Patient presents with  . Joint Swelling  . Hand Pain  . Toe Pain     Patient is a 69 y.o. female presenting with hand pain and toe pain. The history is provided by the patient. No language interpreter was used.  Hand Pain  Toe Pain   Kathy Montgomery presents to the ED for evaluation of leg swelling.  She reports increased swelling in BLE, left greater than right, for the last three days.  She states the swelling is starting to go down today.  She did have a cramp in her left second toe two days ago - now resolved.  She also has some soreness and swelling in her left thumb that is now better.  She denies any leg pain, fevers, chest pain, sob, abdominal pain, vomiting, diarrhea.  She recently had a colonoscopy and thyroid biopsy (4/4 and 4/5) and stopped her coumadin at that time.  She takes coumadin for afib.  She just restarted the coumadin last night.  Sxs are mild, waxing and waning, improving.     Past Medical History  Diagnosis Date  . Hypertension   . MI (myocardial infarction)   . Hypercholesteremia   . Atrial fibrillation 11/14/2012  . Long term (current) use of anticoagulants 12/17/2012    Atrial fibrillation    Past Surgical History  Procedure Laterality Date  . Ectopic pregnancy surgery     Family History  Problem Relation Age of Onset  . Stroke Mother   . Esophageal cancer Sister    History  Substance Use Topics  . Smoking status: Former Smoker    Types: Cigarettes    Quit date: 05/17/2000  . Smokeless tobacco: Not on file  . Alcohol Use: Not on file   OB History    No data available     Review of Systems  All other systems reviewed and are negative.     Allergies  Review of patient's allergies indicates no known allergies.  Home Medications   Prior to Admission medications   Medication Sig Start Date End Date  Taking? Authorizing Provider  amLODipine (NORVASC) 5 MG tablet Take 5 mg by mouth daily.    Historical Provider, MD  amoxicillin (AMOXIL) 500 MG capsule  03/19/14   Historical Provider, MD  atorvastatin (LIPITOR) 40 MG tablet Take 40 mg by mouth at bedtime.    Historical Provider, MD  losartan-hydrochlorothiazide (HYZAAR) 50-12.5 MG per tablet Take by mouth daily. 03/07/14   Historical Provider, MD  Multiple Vitamins-Minerals (MULTIVITAMIN GUMMIES ADULTS) CHEW Chew by mouth daily.    Historical Provider, MD  warfarin (COUMADIN) 5 MG tablet Take as directed by the Coumadin Clinic. 01/14/14   Belva Crome, MD   BP 151/105 mmHg  Pulse 108  Temp(Src) 98.1 F (36.7 C) (Oral)  Resp 18  Ht 5\' 3"  (1.6 m)  Wt 206 lb (93.441 kg)  BMI 36.50 kg/m2  SpO2 99% Physical Exam  Constitutional: She is oriented to person, place, and time. She appears well-developed and well-nourished.  HENT:  Head: Normocephalic and atraumatic.  Cardiovascular: Normal rate and regular rhythm.   No murmur heard. Pulmonary/Chest: Effort normal and breath sounds normal. No respiratory distress.  Abdominal: Soft. There is no tenderness. There is no rebound and no guarding.  Musculoskeletal: She exhibits no tenderness.  Trace pitting edema in  BLE.  No rash.  2+ DP pulses bilaterally.    Neurological: She is alert and oriented to person, place, and time.  Skin: Skin is warm and dry.  Psychiatric: She has a normal mood and affect. Her behavior is normal.  Nursing note and vitals reviewed.   ED Course  Procedures (including critical care time) Labs Review Labs Reviewed  I-STAT CHEM 8, ED - Abnormal; Notable for the following:    Potassium 3.1 (*)    Glucose, Bld 166 (*)    Calcium, Ion 1.34 (*)    Hemoglobin 15.6 (*)    All other components within normal limits  PROTIME-INR    Imaging Review Dg Chest 2 View  05/30/2014   CLINICAL DATA:  Joint swelling, hand pain, toe pain, history of myocardial infarction and  hypertension  EXAM: CHEST  2 VIEW  COMPARISON:  10/28/2012  FINDINGS: Heart size upper normal and stable. Vascular pattern normal. Lungs clear. No effusions.  IMPRESSION: No active cardiopulmonary disease.   Electronically Signed   By: Skipper Cliche M.D.   On: 05/30/2014 18:18     EKG Interpretation None      MDM   Final diagnoses:  Bilateral edema of lower extremity  Hypokalemia    Patient here for evaluation of lower extremity edema which has recently improved. History and presentation is not consistent with acute renal failure, acute congestive heart failure. Patient has mild hypokalemia. Coumadin level is not currently therapeutic but this was just restarted yesterday, recommends continuing Coumadin therapy with follow up with the Coumadin clinic. Patient is well perfused on exam there is no evidence of acute arterial thrombosis. History of presentation is not consistent with DVT. There is no evidence of cellulitis. Providing one dose of Lasix in the emergency department for symptomatic relief with planned outpatient follow-up. Patient is mildly hypokalemic and this was replaced.    Kathy Reichert, MD 05/30/14 [place]

## 2014-06-01 ENCOUNTER — Telehealth: Payer: Self-pay | Admitting: Interventional Cardiology

## 2014-06-01 NOTE — Telephone Encounter (Signed)
con'td She was given 1 dose of Lasix in the ED. Her lab results showed that her K+ was low 3.1. She was given 2 K+ tabs in the ED and released. The cramping in her left hand has returned. She contributes this to her low potassium. Her Le edema has improved. Pt denies any other symptoms. Adv her to increase potassium in her diet i.e. Bananas, raisins, O.J. Pt rqst an earlier appt with Dr.Smith to discuss switching anticoag agents and her cardiac concerns.pt appt scheduled for 4/26 @ 10:45. Adv her I will fwd a message to his scheduler to look out for any cancellations. Adv pt I will fwd an update to Dr.Smith and call back with his recommendation

## 2014-06-01 NOTE — Telephone Encounter (Signed)
Pt c/o medication issue: 1. Name of Medication:  2. How are you currently taking this medication (dosage and times per day)?  3. Are you having a reaction (difficulty breathing--STAT)?    4. What is your medication issue? Pt called states that her fingers are cramping and Locking. Experiencing low potassium, Requests a call back to discuss sooner appt than may. Please call

## 2014-06-01 NOTE — Telephone Encounter (Signed)
Returned pt call. Pt sts that she was seen in the ED on 4/16 for Le edema and pain in her left hand.

## 2014-06-01 NOTE — Telephone Encounter (Signed)
Follow UP  Pt following up- wants to speak w/ Rn. Please call back and discuss.

## 2014-06-02 NOTE — Telephone Encounter (Signed)
She needs supplements that have potassium, I do not think we need to repeat the lab work.

## 2014-06-04 ENCOUNTER — Ambulatory Visit (INDEPENDENT_AMBULATORY_CARE_PROVIDER_SITE_OTHER): Payer: Medicare Other | Admitting: *Deleted

## 2014-06-04 DIAGNOSIS — Z5181 Encounter for therapeutic drug level monitoring: Secondary | ICD-10-CM | POA: Diagnosis not present

## 2014-06-04 DIAGNOSIS — I480 Paroxysmal atrial fibrillation: Secondary | ICD-10-CM | POA: Diagnosis not present

## 2014-06-04 DIAGNOSIS — I4891 Unspecified atrial fibrillation: Secondary | ICD-10-CM

## 2014-06-04 LAB — POCT INR: INR: 2.1

## 2014-06-09 ENCOUNTER — Encounter: Payer: Self-pay | Admitting: Interventional Cardiology

## 2014-06-09 ENCOUNTER — Ambulatory Visit (INDEPENDENT_AMBULATORY_CARE_PROVIDER_SITE_OTHER): Payer: Medicare Other | Admitting: Interventional Cardiology

## 2014-06-09 VITALS — BP 130/72 | HR 62 | Ht 63.0 in | Wt 209.1 lb

## 2014-06-09 DIAGNOSIS — E876 Hypokalemia: Secondary | ICD-10-CM | POA: Diagnosis not present

## 2014-06-09 DIAGNOSIS — I480 Paroxysmal atrial fibrillation: Secondary | ICD-10-CM | POA: Diagnosis not present

## 2014-06-09 DIAGNOSIS — I10 Essential (primary) hypertension: Secondary | ICD-10-CM

## 2014-06-09 DIAGNOSIS — Z7901 Long term (current) use of anticoagulants: Secondary | ICD-10-CM | POA: Diagnosis not present

## 2014-06-09 NOTE — Progress Notes (Signed)
Cardiology Office Note   Date:  06/09/2014   ID:  Kathy Montgomery, DOB Dec 16, 1945, MRN [id number]  PCP:  Kandice Hams, MD  Cardiologist:   Sinclair Grooms, MD   Chief Complaint  Patient presents with  . Atrial Fibrillation      History of Present Illness: Kathy Montgomery is a 69 y.o. female who presents for paroxysmal atrial fibrillation, hypertension, and chronic anticoagulation.  She had a recent emergency room visit for a trigger finger. Was found to have lower extremity edema and given one dose of Lasix. She has done fine since that time. Obviously the edema and the trigger finger would not related. They asked that she follow-up in cardiology clinic. She has had no recurrence of the edema. She denies palpitations and chest pain.    Past Medical History  Diagnosis Date  . Hypertension   . MI (myocardial infarction)   . Hypercholesteremia   . Atrial fibrillation 11/14/2012  . Long term (current) use of anticoagulants 12/17/2012    Atrial fibrillation     Past Surgical History  Procedure Laterality Date  . Ectopic pregnancy surgery       Current Outpatient Prescriptions  Medication Sig Dispense Refill  . amLODipine (NORVASC) 5 MG tablet Take 5 mg by mouth at bedtime.     Marland Kitchen atorvastatin (LIPITOR) 40 MG tablet Take 40 mg by mouth at bedtime.    Marland Kitchen losartan-hydrochlorothiazide (HYZAAR) 50-12.5 MG per tablet Take 1 tablet by mouth at bedtime.     . Multiple Vitamins-Minerals (MULTIVITAMIN GUMMIES ADULTS) CHEW Chew 2 tablets by mouth at bedtime.     Marland Kitchen warfarin (COUMADIN) 5 MG tablet Take as directed by the [facility]. (Patient taking differently: Take 2.5-5 mg by mouth at bedtime. Take 2 tablets (10 mg) on 05/29/14 and 05/30/14, then take 1/2 tablet (2.5 mg) on Fridays and 1 tablet (5 mg) on Sunday, Monday, Tuesday, Wednesday, Thursday, Saturday or as directed by the [facility].) 60 tablet 1   No current facility-administered medications for this visit.     Allergies:   Review of patient's allergies indicates no known allergies.    Social History:  The patient  reports that she quit smoking about 14 years ago. Her smoking use included Cigarettes. She does not have any smokeless tobacco history on file.   Family History:  The patient's family history includes Esophageal cancer in her sister; Stroke in her mother.    ROS:  Please see the history of present illness.   Otherwise, review of systems are positive for concern that she may have a stroke, has noticed lumps in her throat and on her lower extremities. Concerned about the possibility of cancer..   All other systems are reviewed and negative.    PHYSICAL EXAM: VS:  BP 130/72 mmHg  Pulse 62  Ht 5\' 3"  (1.6 m)  Wt 209 lb 1.9 oz (94.856 kg)  BMI 37.05 kg/m2  SpO2 95% , BMI Body mass index is 37.05 kg/(m^2). GEN: Well nourished, well developed, in no acute distress HEENT: normal Neck: no JVD, carotid bruits, or masses Cardiac: RRR; no murmurs, rubs, or gallops,no edema  Respiratory:  clear to auscultation bilaterally, normal work of breathing GI: soft, nontender, nondistended, + BS MS: no deformity or atrophy Skin: warm and dry, no rash Neuro:  Strength and sensation are intact Psych: euthymic mood, full affect   EKG:  EKG is ordered today.    Recent Labs: 05/30/2014: BUN 15; Creatinine 1.00; Hemoglobin 15.6*;  Potassium 3.1*; Sodium 140    Lipid Panel    Component Value Date/Time   CHOL 158 05/09/2007 2043   TRIG 97 05/09/2007 2043   HDL 45 05/09/2007 2043   CHOLHDL 3.5 Ratio 05/09/2007 2043   VLDL 19 05/09/2007 2043   LDLCALC 94 05/09/2007 2043      Wt Readings from Last 3 Encounters:  06/09/14 209 lb 1.9 oz (94.856 kg)  05/30/14 206 lb (93.441 kg)  05/11/14 206 lb (93.441 kg)      Other studies Reviewed: Additional studies/ records that were reviewed today include: .    ASSESSMENT AND PLAN:  Chronic anticoagulation: No bleeding  complications  Paroxysmal atrial fibrillation: Asymptomatic  HYPERTENSION, BENIGN SYSTEMIC: Controlled  Hypokalemia: Resolved     Current medicines are reviewed at length with the patient today.  The patient does not have concerns regarding medicines.  The following changes have been made:  no change  Labs/ tests ordered today include:  No orders of the defined types were placed in this encounter.     Disposition:   FU with HS in 1 year  Signed, Sinclair Grooms, MD  06/09/2014 1:07 PM    [address], [place], [place]  [address] Phone: [phone]; Fax: [phone]

## 2014-06-09 NOTE — Patient Instructions (Signed)

## 2014-07-16 ENCOUNTER — Ambulatory Visit (INDEPENDENT_AMBULATORY_CARE_PROVIDER_SITE_OTHER): Payer: Medicare Other | Admitting: Pharmacist

## 2014-07-16 DIAGNOSIS — I480 Paroxysmal atrial fibrillation: Secondary | ICD-10-CM

## 2014-07-16 DIAGNOSIS — Z5181 Encounter for therapeutic drug level monitoring: Secondary | ICD-10-CM

## 2014-07-16 LAB — POCT INR: INR: 3

## 2014-07-22 ENCOUNTER — Other Ambulatory Visit: Payer: Self-pay | Admitting: Interventional Cardiology

## 2014-08-21 ENCOUNTER — Telehealth: Payer: Self-pay | Admitting: Interventional Cardiology

## 2014-08-21 NOTE — Telephone Encounter (Signed)
Wise calling about Records for the insurance application for above pt. He stated that he sent request about a month ago. He's following up. Please call back and discuss.

## 2014-08-27 ENCOUNTER — Ambulatory Visit (INDEPENDENT_AMBULATORY_CARE_PROVIDER_SITE_OTHER): Payer: Medicare Other | Admitting: *Deleted

## 2014-08-27 DIAGNOSIS — I480 Paroxysmal atrial fibrillation: Secondary | ICD-10-CM | POA: Diagnosis not present

## 2014-08-27 DIAGNOSIS — Z5181 Encounter for therapeutic drug level monitoring: Secondary | ICD-10-CM

## 2014-08-27 LAB — POCT INR: INR: 3.8

## 2014-09-02 ENCOUNTER — Telehealth: Payer: Self-pay

## 2014-09-02 NOTE — Telephone Encounter (Signed)
-----   Message from Stanton Kidney, RN sent at 08/27/2014  9:37 AM EDT ----- Regarding: Appt w/ Tamala Julian Patient was here today for Coumadin clinic.  Triage saw patient, per her request:   She complains of abdominal, BLEE and pedal edema over past month.  Reports it gets very tight in lower extremities.  States she is monitoring her sodium intake. Denies any other symptoms. This is concerning her with heart hx.  Offered to make an appt w/ PA/NP, but patient would like to see Tamala Julian, per her statement.  Told her I would forward information to you two to determine f/u for her. She would like to be contacted at [phone]

## 2014-09-02 NOTE — Telephone Encounter (Signed)
Received message staff message Huey Romans, RN. Pt having LE swelling and rqst an appt with Dr.Smith only. Called to ck on pt. lmtcb if assistance is still needed

## 2014-09-02 NOTE — Telephone Encounter (Signed)
-----   Message from Stanton Kidney, RN sent at 08/27/2014  9:37 AM EDT ----- Regarding: Appt w/ Tamala Julian Patient was here today for Coumadin clinic.  Triage saw patient, per her request:   She complains of abdominal, BLEE and pedal edema over past month.  Reports it gets very tight in lower extremities.  States she is monitoring her sodium intake. Denies any other symptoms. This is concerning her with heart hx.  Offered to make an appt w/ PA/NP, but patient would like to see Tamala Julian, per her statement.  Told her I would forward information to you two to determine f/u for her. She would like to be contacted at [phone]

## 2014-09-06 ENCOUNTER — Encounter (HOSPITAL_COMMUNITY): Payer: Self-pay | Admitting: *Deleted

## 2014-09-06 ENCOUNTER — Emergency Department (HOSPITAL_COMMUNITY)
Admission: EM | Admit: 2014-09-06 | Discharge: 2014-09-06 | Disposition: A | Discharge status: Home / Self Care | Payer: Medicare Other | Attending: Emergency Medicine | Admitting: Emergency Medicine

## 2014-09-06 DIAGNOSIS — I10 Essential (primary) hypertension: Secondary | ICD-10-CM | POA: Insufficient documentation

## 2014-09-06 DIAGNOSIS — E780 Pure hypercholesterolemia: Secondary | ICD-10-CM | POA: Diagnosis not present

## 2014-09-06 DIAGNOSIS — Z7901 Long term (current) use of anticoagulants: Secondary | ICD-10-CM | POA: Insufficient documentation

## 2014-09-06 DIAGNOSIS — N939 Abnormal uterine and vaginal bleeding, unspecified: Secondary | ICD-10-CM | POA: Diagnosis present

## 2014-09-06 DIAGNOSIS — I4891 Unspecified atrial fibrillation: Secondary | ICD-10-CM | POA: Diagnosis not present

## 2014-09-06 DIAGNOSIS — R600 Localized edema: Secondary | ICD-10-CM | POA: Insufficient documentation

## 2014-09-06 DIAGNOSIS — Z79899 Other long term (current) drug therapy: Secondary | ICD-10-CM | POA: Insufficient documentation

## 2014-09-06 DIAGNOSIS — I252 Old myocardial infarction: Secondary | ICD-10-CM | POA: Diagnosis not present

## 2014-09-06 DIAGNOSIS — Z87891 Personal history of nicotine dependence: Secondary | ICD-10-CM | POA: Insufficient documentation

## 2014-09-06 DIAGNOSIS — R319 Hematuria, unspecified: Secondary | ICD-10-CM | POA: Insufficient documentation

## 2014-09-06 LAB — CBC WITH DIFFERENTIAL/PLATELET
Basophils Absolute: 0.1 10*3/uL (ref 0.0–0.1)
Basophils Relative: 0 % (ref 0–1)
EOS ABS: 0.1 10*3/uL (ref 0.0–0.7)
Eosinophils Relative: 1 % (ref 0–5)
HCT: 41.2 % (ref 36.0–46.0)
HEMOGLOBIN: 13.7 g/dL (ref 12.0–15.0)
Lymphocytes Relative: 34 % (ref 12–46)
Lymphs Abs: 4.1 10*3/uL — ABNORMAL HIGH (ref 0.7–4.0)
MCH: 30.1 pg (ref 26.0–34.0)
MCHC: 33.3 g/dL (ref 30.0–36.0)
MCV: 90.5 fL (ref 78.0–100.0)
MONOS PCT: 9 % (ref 3–12)
Monocytes Absolute: 1.1 10*3/uL — ABNORMAL HIGH (ref 0.1–1.0)
NEUTROS ABS: 6.7 10*3/uL (ref 1.7–7.7)
NEUTROS PCT: 56 % (ref 43–77)
Platelets: 157 10*3/uL (ref 150–400)
RBC: 4.55 MIL/uL (ref 3.87–5.11)
RDW: 13.8 % (ref 11.5–15.5)
WBC: 12 10*3/uL — ABNORMAL HIGH (ref 4.0–10.5)

## 2014-09-06 LAB — BASIC METABOLIC PANEL
ANION GAP: 7 (ref 5–15)
BUN: 11 mg/dL (ref 6–20)
CALCIUM: 9.3 mg/dL (ref 8.9–10.3)
CO2: 26 mmol/L (ref 22–32)
Chloride: 99 mmol/L — ABNORMAL LOW (ref 101–111)
Creatinine, Ser: 1 mg/dL (ref 0.44–1.00)
GFR calc Af Amer: >60 mL/min (ref >60)
GFR, EST NON AFRICAN AMERICAN: 57 mL/min — AB (ref >60)
Glucose, Bld: 96 mg/dL (ref 65–99)
Potassium: 3.5 mmol/L (ref 3.5–5.1)
Sodium: 132 mmol/L — ABNORMAL LOW (ref 135–145)

## 2014-09-06 LAB — URINALYSIS, ROUTINE W REFLEX MICROSCOPIC
Bilirubin Urine: NEGATIVE
Glucose, UA: NEGATIVE mg/dL
Ketones, ur: NEGATIVE mg/dL
Leukocytes, UA: NEGATIVE
Nitrite: NEGATIVE
PROTEIN: NEGATIVE mg/dL
Specific Gravity, Urine: 1.022 (ref 1.005–1.030)
UROBILINOGEN UA: 0.2 mg/dL (ref 0.0–1.0)
pH: 5 (ref 5.0–8.0)

## 2014-09-06 LAB — URINE MICROSCOPIC-ADD ON

## 2014-09-06 LAB — PROTIME-INR
INR: 3.34 — ABNORMAL HIGH (ref 0.00–1.49)
PROTHROMBIN TIME: 33.2 s — AB (ref 11.6–15.2)

## 2014-09-06 NOTE — ED Notes (Signed)
Attempted blood draw by Nurse and Nurse tech. Spoke with phlebotomy who will attempt.

## 2014-09-06 NOTE — ED Notes (Addendum)
Pt reports dark red vaginal bleeding when she was urinating today. Only happened once. Pt denies clots, reports some on the tissue when she wiped. Pt is on coumadin. Pt also states she is retaining fluid.

## 2014-09-06 NOTE — ED Provider Notes (Signed)
CSN: [id number]     Arrival date & time 09/06/14  1929 History   First MD Initiated Contact with Patient 09/06/14 2102     Chief Complaint  Patient presents with  . Vaginal Bleeding     Patient is a 69 y.o. female presenting with vaginal bleeding. The history is provided by the patient. No language interpreter was used.  Vaginal Bleeding  Kathy Montgomery presents for evaluation of vaginal bleeding.  Today she developed a small amt of vaginal bleeding about 1 hr prior to arrival (blood when she wiped, bright red).  No abdominal pain, dysuria, chest pain, sob.  No prior similar sxs.  She has had one month of increasing lower extremity/abdominal swelling.  She takes coumadin for paroxsymal afib.  She had medication changes a week ago.    Past Medical History  Diagnosis Date  . Hypertension   . MI (myocardial infarction)   . Hypercholesteremia   . Atrial fibrillation 11/14/2012  . Long term (current) use of anticoagulants 12/17/2012    Atrial fibrillation    Past Surgical History  Procedure Laterality Date  . Ectopic pregnancy surgery     Family History  Problem Relation Age of Onset  . Stroke Mother   . Esophageal cancer Sister    History  Substance Use Topics  . Smoking status: Former Smoker    Types: Cigarettes    Quit date: 05/17/2000  . Smokeless tobacco: Not on file  . Alcohol Use: Not on file   OB History    No data available     Review of Systems  Genitourinary: Positive for vaginal bleeding.  All other systems reviewed and are negative.     Allergies  Review of patient's allergies indicates no known allergies.  Home Medications   Prior to Admission medications   Medication Sig Start Date End Date Taking? Authorizing Provider  amLODipine (NORVASC) 5 MG tablet Take 5 mg by mouth at bedtime.     Historical Provider, MD  atorvastatin (LIPITOR) 40 MG tablet Take 40 mg by mouth at bedtime.    Historical Provider, MD  losartan-hydrochlorothiazide (HYZAAR) 50-12.5  MG per tablet Take 1 tablet by mouth at bedtime.  03/07/14   Historical Provider, MD  Multiple Vitamins-Minerals (MULTIVITAMIN GUMMIES ADULTS) CHEW Chew 2 tablets by mouth at bedtime.     Historical Provider, MD  warfarin (COUMADIN) 5 MG tablet Take 1 tablet (5 mg total) by mouth as directed. 07/23/14   Belva Crome, MD   BP 172/67 mmHg  Pulse 65  Temp(Src) 98.2 F (36.8 C) (Oral)  Resp 16  Ht 5\' 3"  (1.6 m)  Wt 216 lb (97.977 kg)  BMI 38.27 kg/m2  SpO2 100% Physical Exam  Constitutional: She is oriented to person, place, and time. She appears well-developed and well-nourished.  HENT:  Head: Normocephalic and atraumatic.  Cardiovascular: Normal rate and regular rhythm.   No murmur heard. Pulmonary/Chest: Effort normal and breath sounds normal. No respiratory distress.  Abdominal: Soft. There is no tenderness. There is no rebound and no guarding.  Genitourinary: Vagina normal. No vaginal discharge found.  No blood in vaginal vault, no blood at urethral meatus  Musculoskeletal: She exhibits no tenderness.  1+ pitting edema of BLE  Neurological: She is alert and oriented to person, place, and time.  Skin: Skin is warm and dry.  Psychiatric: She has a normal mood and affect. Her behavior is normal.  Nursing note and vitals reviewed.   ED Course  Procedures (including critical care  time) Labs Review Labs Reviewed  BASIC METABOLIC PANEL - Abnormal; Notable for the following:    Sodium 132 (*)    Chloride 99 (*)    GFR calc non Af Amer 57 (*)    All other components within normal limits  CBC WITH DIFFERENTIAL/PLATELET - Abnormal; Notable for the following:    WBC 12.0 (*)    Lymphs Abs 4.1 (*)    Monocytes Absolute 1.1 (*)    All other components within normal limits  URINALYSIS, ROUTINE W REFLEX MICROSCOPIC (NOT AT [facility]) - Abnormal; Notable for the following:    APPearance CLOUDY (*)    Hgb urine dipstick LARGE (*)    All other components within normal limits  PROTIME-INR -  Abnormal; Notable for the following:    Prothrombin Time 33.2 (*)    INR 3.34 (*)    All other components within normal limits  URINE MICROSCOPIC-ADD ON - Abnormal; Notable for the following:    Bacteria, UA FEW (*)    Crystals CA OXALATE CRYSTALS (*)    All other components within normal limits  URINE CULTURE    Imaging Review No results found.   EKG Interpretation None      MDM   Final diagnoses:  Hematuria     [name] presents for evaluation of vaginal bleeding. There is no evidence of vaginal bleeding on pelvic exam, no gross hematuria. In and out catheter does show some trace microscopic hematuria. Patient's INR is slightly elevated. Discussed holding Coumadin with outpatient urology, OB/GYN and PCP follow-up. Bleeding precautions were discussed.    Quintella Reichert, MD 09/07/14 [phone]

## 2014-09-06 NOTE — Discharge Instructions (Signed)
Your INR was 3.3 today.  Do not take your coumadin tonight.  Your sodium was slightly low today, this will need to be rechecked by your family doctor in the next week.  Please follow up with Urology for the blood in your urine and OBGYN for the possible vaginal bleeding.  Get rechecked immediately if you develop any new or worrisome symptoms.    Hematuria Hematuria is blood in your urine. It can be caused by a bladder infection, kidney infection, prostate infection, kidney stone, or cancer of your urinary tract. Infections can usually be treated with medicine, and a kidney stone usually will pass through your urine. If neither of these is the cause of your hematuria, further workup to find out the reason may be needed. It is very important that you tell your health care provider about any blood you see in your urine, even if the blood stops without treatment or happens without causing pain. Blood in your urine that happens and then stops and then happens again can be a symptom of a very serious condition. Also, pain is not a symptom in the initial stages of many urinary cancers. HOME CARE INSTRUCTIONS   Drink lots of fluid, 3-4 quarts a day. If you have been diagnosed with an infection, cranberry juice is especially recommended, in addition to large amounts of water.  Avoid caffeine, tea, and carbonated beverages because they tend to irritate the bladder.  Avoid alcohol because it may irritate the prostate.  Take all medicines as directed by your health care provider.  If you were prescribed an antibiotic medicine, finish it all even if you start to feel better.  If you have been diagnosed with a kidney stone, follow your health care provider's instructions regarding straining your urine to catch the stone.  Empty your bladder often. Avoid holding urine for long periods of time.  After a bowel movement, women should cleanse front to back. Use each tissue only once.  Empty your bladder before  and after sexual intercourse if you are a female. SEEK MEDICAL CARE IF:  You develop back pain.  You have a fever.  You have a feeling of sickness in your stomach (nausea) or vomiting.  Your symptoms are not better in 3 days. Return sooner if you are getting worse. SEEK IMMEDIATE MEDICAL CARE IF:   You develop severe vomiting and are unable to keep the medicine down.  You develop severe back or abdominal pain despite taking your medicines.  You begin passing a large amount of blood or clots in your urine.  You feel extremely weak or faint, or you pass out. MAKE SURE YOU:   Understand these instructions.  Will watch your condition.  Will get help right away if you are not doing well or get worse. Document Released: 01/30/2005 Document Revised: 06/16/2013 Document Reviewed: 09/30/2012 [facility] Patient Information 2015 [place], [place]. This information is not intended to replace advice given to you by your health care provider. Make sure you discuss any questions you have with your health care provider.

## 2014-09-07 NOTE — Telephone Encounter (Signed)
Return call to patient in reference to blood in urine. She has not seen any bleeding since her ER visit.  She has called to schedule appt with Urology & GYN.  Advised to resume Coumadin today at same dose, she verbalized understanding and states she will resume.    Also, she wanted to inform us that she has been taking Lasix 40mg  1/2 tablet daily as prescribed by her physician and is on Losartan 50mg  daily (not Losartan/HCTZ 50mg /12.5).  Advised we will update this in the system when she comes to appointment.

## 2014-09-07 NOTE — Telephone Encounter (Signed)
Follow Up   Pt requests a call back to bring her up to date on her bleeding. Pt states she was in the ED and was advised to not take her coumadin on 07/24. Pt request a call back to determine how to move forward with her coumadin and she also reports that they found  Blood in her urine and she would like to discuss that as well. Please call .

## 2014-09-08 LAB — URINE CULTURE: Culture: NO GROWTH

## 2014-09-11 ENCOUNTER — Ambulatory Visit (INDEPENDENT_AMBULATORY_CARE_PROVIDER_SITE_OTHER): Payer: Medicare Other | Admitting: *Deleted

## 2014-09-11 DIAGNOSIS — Z5181 Encounter for therapeutic drug level monitoring: Secondary | ICD-10-CM | POA: Diagnosis not present

## 2014-09-11 DIAGNOSIS — I480 Paroxysmal atrial fibrillation: Secondary | ICD-10-CM | POA: Diagnosis not present

## 2014-09-11 LAB — POCT INR: INR: 3.3

## 2014-09-15 ENCOUNTER — Other Ambulatory Visit (HOSPITAL_COMMUNITY)
Admission: RE | Admit: 2014-09-15 | Discharge: 2014-09-15 | Disposition: A | Discharge status: Home / Self Care | Payer: Medicare Other | Source: Ambulatory Visit | Attending: Obstetrics and Gynecology | Admitting: Obstetrics and Gynecology

## 2014-09-15 ENCOUNTER — Other Ambulatory Visit: Payer: Self-pay | Admitting: Obstetrics and Gynecology

## 2014-09-15 DIAGNOSIS — Z01419 Encounter for gynecological examination (general) (routine) without abnormal findings: Secondary | ICD-10-CM | POA: Insufficient documentation

## 2014-09-17 LAB — CYTOLOGY - PAP

## 2014-09-24 ENCOUNTER — Telehealth: Payer: Self-pay

## 2014-09-24 NOTE — Telephone Encounter (Signed)
-----   Message from Belva Crome, MD sent at 09/06/2014  1:12 PM EDT ----- Regarding: FW: Appt w/ Tamala Julian She needs aldactone 25 mg daily, BMET 1 week, daily weights, and OV with me or APP in 2-4 weeks. Call weights weekly until OV. ----- Message -----    From: Stanton Kidney, RN    Sent: 08/27/2014   9:37 AM      To: Belva Crome, MD, Lamar Laundry, CMA Subject: Appt w/ Tamala Julian                                  Patient was here today for Coumadin clinic.  Triage saw patient, per her request:   She complains of abdominal, BLEE and pedal edema over past month.  Reports it gets very tight in lower extremities.  States she is monitoring her sodium intake. Denies any other symptoms. This is concerning her with heart hx.  Offered to make an appt w/ PA/NP, but patient would like to see Tamala Julian, per her statement.  Told her I would forward information to you two to determine f/u for her. She would like to be contacted at [phone]

## 2014-09-28 ENCOUNTER — Ambulatory Visit (INDEPENDENT_AMBULATORY_CARE_PROVIDER_SITE_OTHER): Payer: Medicare Other | Admitting: *Deleted

## 2014-09-28 DIAGNOSIS — Z5181 Encounter for therapeutic drug level monitoring: Secondary | ICD-10-CM

## 2014-09-28 DIAGNOSIS — I480 Paroxysmal atrial fibrillation: Secondary | ICD-10-CM | POA: Diagnosis not present

## 2014-09-28 LAB — POCT INR: INR: 1.7

## 2014-10-12 ENCOUNTER — Ambulatory Visit (INDEPENDENT_AMBULATORY_CARE_PROVIDER_SITE_OTHER): Payer: Medicare Other | Admitting: Pharmacist

## 2014-10-12 DIAGNOSIS — I480 Paroxysmal atrial fibrillation: Secondary | ICD-10-CM | POA: Diagnosis not present

## 2014-10-12 DIAGNOSIS — Z5181 Encounter for therapeutic drug level monitoring: Secondary | ICD-10-CM

## 2014-10-12 LAB — POCT INR: INR: 1.6

## 2014-10-22 ENCOUNTER — Other Ambulatory Visit: Payer: Self-pay

## 2014-10-22 DIAGNOSIS — Z1231 Encounter for screening mammogram for malignant neoplasm of breast: Secondary | ICD-10-CM

## 2014-10-26 ENCOUNTER — Other Ambulatory Visit: Payer: Self-pay | Admitting: Interventional Cardiology

## 2014-10-29 ENCOUNTER — Ambulatory Visit: Payer: Medicare Other

## 2014-10-29 ENCOUNTER — Ambulatory Visit (INDEPENDENT_AMBULATORY_CARE_PROVIDER_SITE_OTHER): Payer: Medicare Other

## 2014-10-29 DIAGNOSIS — I480 Paroxysmal atrial fibrillation: Secondary | ICD-10-CM | POA: Diagnosis not present

## 2014-10-29 DIAGNOSIS — Z5181 Encounter for therapeutic drug level monitoring: Secondary | ICD-10-CM | POA: Diagnosis not present

## 2014-10-29 LAB — POCT INR: INR: 3.8

## 2014-11-12 ENCOUNTER — Ambulatory Visit (INDEPENDENT_AMBULATORY_CARE_PROVIDER_SITE_OTHER): Payer: Medicare Other | Admitting: *Deleted

## 2014-11-12 DIAGNOSIS — I480 Paroxysmal atrial fibrillation: Secondary | ICD-10-CM

## 2014-11-12 DIAGNOSIS — Z5181 Encounter for therapeutic drug level monitoring: Secondary | ICD-10-CM

## 2014-11-12 LAB — POCT INR: INR: 3.2

## 2014-11-24 ENCOUNTER — Ambulatory Visit (INDEPENDENT_AMBULATORY_CARE_PROVIDER_SITE_OTHER): Payer: Medicare Other | Admitting: *Deleted

## 2014-11-24 DIAGNOSIS — I480 Paroxysmal atrial fibrillation: Secondary | ICD-10-CM

## 2014-11-24 DIAGNOSIS — Z5181 Encounter for therapeutic drug level monitoring: Secondary | ICD-10-CM | POA: Diagnosis not present

## 2014-11-24 LAB — POCT INR: INR: 2.6

## 2014-12-14 ENCOUNTER — Ambulatory Visit (INDEPENDENT_AMBULATORY_CARE_PROVIDER_SITE_OTHER): Payer: Medicare Other | Admitting: Pharmacist

## 2014-12-14 DIAGNOSIS — Z5181 Encounter for therapeutic drug level monitoring: Secondary | ICD-10-CM | POA: Diagnosis not present

## 2014-12-14 DIAGNOSIS — I480 Paroxysmal atrial fibrillation: Secondary | ICD-10-CM | POA: Diagnosis not present

## 2014-12-14 LAB — POCT INR: INR: 4.7

## 2014-12-28 ENCOUNTER — Ambulatory Visit (INDEPENDENT_AMBULATORY_CARE_PROVIDER_SITE_OTHER): Payer: Medicare Other | Admitting: Pharmacist

## 2014-12-28 DIAGNOSIS — Z5181 Encounter for therapeutic drug level monitoring: Secondary | ICD-10-CM | POA: Diagnosis not present

## 2014-12-28 DIAGNOSIS — I480 Paroxysmal atrial fibrillation: Secondary | ICD-10-CM

## 2014-12-28 LAB — POCT INR: INR: 3.3

## 2015-01-13 ENCOUNTER — Ambulatory Visit (INDEPENDENT_AMBULATORY_CARE_PROVIDER_SITE_OTHER): Payer: Medicare Other | Admitting: *Deleted

## 2015-01-13 DIAGNOSIS — I480 Paroxysmal atrial fibrillation: Secondary | ICD-10-CM

## 2015-01-13 DIAGNOSIS — Z5181 Encounter for therapeutic drug level monitoring: Secondary | ICD-10-CM | POA: Diagnosis not present

## 2015-01-13 LAB — POCT INR: INR: 1.6

## 2015-01-22 ENCOUNTER — Ambulatory Visit (INDEPENDENT_AMBULATORY_CARE_PROVIDER_SITE_OTHER): Payer: Medicare Other | Admitting: *Deleted

## 2015-01-22 DIAGNOSIS — I480 Paroxysmal atrial fibrillation: Secondary | ICD-10-CM | POA: Diagnosis not present

## 2015-01-22 DIAGNOSIS — Z5181 Encounter for therapeutic drug level monitoring: Secondary | ICD-10-CM

## 2015-01-22 LAB — POCT INR: INR: 2.7

## 2015-01-30 ENCOUNTER — Other Ambulatory Visit: Payer: Self-pay | Admitting: Interventional Cardiology

## 2015-02-04 ENCOUNTER — Ambulatory Visit (INDEPENDENT_AMBULATORY_CARE_PROVIDER_SITE_OTHER): Payer: Medicare Other | Admitting: Pharmacist

## 2015-02-04 DIAGNOSIS — I480 Paroxysmal atrial fibrillation: Secondary | ICD-10-CM

## 2015-02-04 DIAGNOSIS — Z5181 Encounter for therapeutic drug level monitoring: Secondary | ICD-10-CM

## 2015-02-04 LAB — POCT INR: INR: 2.8

## 2015-02-23 ENCOUNTER — Telehealth: Payer: Self-pay | Admitting: Interventional Cardiology

## 2015-02-23 NOTE — Telephone Encounter (Signed)
New Message  Pt called request a call back to discuss if it is ok to take a Z pack with her coumadin. Please call back to discuss.

## 2015-02-23 NOTE — Telephone Encounter (Signed)
Returned call to patient, pt has a URI and has been rx Zpack, Engineer, water, Armed forces operational officer, and Flonase. Advised pt ok to take these meds with Coumadin, Zpack can cause INR to elevate.  Pt has f/u appt already scheduled for 02/25/15.  Advised pt to start rx meds and keep scheduled f/u appt on 02/25/15 at 8:30am.

## 2015-02-25 ENCOUNTER — Ambulatory Visit (INDEPENDENT_AMBULATORY_CARE_PROVIDER_SITE_OTHER): Payer: Medicare Other | Admitting: Pharmacist

## 2015-02-25 DIAGNOSIS — I480 Paroxysmal atrial fibrillation: Secondary | ICD-10-CM | POA: Diagnosis not present

## 2015-02-25 DIAGNOSIS — Z5181 Encounter for therapeutic drug level monitoring: Secondary | ICD-10-CM

## 2015-02-25 LAB — POCT INR: INR: 2.4

## 2015-03-04 ENCOUNTER — Ambulatory Visit (INDEPENDENT_AMBULATORY_CARE_PROVIDER_SITE_OTHER): Payer: Medicare Other | Admitting: *Deleted

## 2015-03-04 DIAGNOSIS — I480 Paroxysmal atrial fibrillation: Secondary | ICD-10-CM

## 2015-03-04 DIAGNOSIS — Z5181 Encounter for therapeutic drug level monitoring: Secondary | ICD-10-CM | POA: Diagnosis not present

## 2015-03-04 LAB — POCT INR: INR: 2.7

## 2015-04-05 ENCOUNTER — Ambulatory Visit (INDEPENDENT_AMBULATORY_CARE_PROVIDER_SITE_OTHER): Payer: Medicare Other

## 2015-04-05 DIAGNOSIS — I480 Paroxysmal atrial fibrillation: Secondary | ICD-10-CM

## 2015-04-05 DIAGNOSIS — Z5181 Encounter for therapeutic drug level monitoring: Secondary | ICD-10-CM

## 2015-04-05 LAB — POCT INR: INR: 2.2

## 2015-05-03 ENCOUNTER — Ambulatory Visit (INDEPENDENT_AMBULATORY_CARE_PROVIDER_SITE_OTHER): Payer: Medicare Other | Admitting: *Deleted

## 2015-05-03 DIAGNOSIS — Z5181 Encounter for therapeutic drug level monitoring: Secondary | ICD-10-CM | POA: Diagnosis not present

## 2015-05-03 DIAGNOSIS — I480 Paroxysmal atrial fibrillation: Secondary | ICD-10-CM | POA: Diagnosis not present

## 2015-05-03 LAB — POCT INR: INR: 3.4

## 2015-05-24 ENCOUNTER — Ambulatory Visit (INDEPENDENT_AMBULATORY_CARE_PROVIDER_SITE_OTHER): Payer: Medicare Other

## 2015-05-24 DIAGNOSIS — I480 Paroxysmal atrial fibrillation: Secondary | ICD-10-CM | POA: Diagnosis not present

## 2015-05-24 DIAGNOSIS — Z5181 Encounter for therapeutic drug level monitoring: Secondary | ICD-10-CM

## 2015-05-24 LAB — POCT INR: INR: 2.4

## 2015-06-21 ENCOUNTER — Ambulatory Visit (INDEPENDENT_AMBULATORY_CARE_PROVIDER_SITE_OTHER): Payer: Medicare Other | Admitting: *Deleted

## 2015-06-21 DIAGNOSIS — I480 Paroxysmal atrial fibrillation: Secondary | ICD-10-CM

## 2015-06-21 DIAGNOSIS — Z5181 Encounter for therapeutic drug level monitoring: Secondary | ICD-10-CM

## 2015-06-21 LAB — POCT INR: INR: 3.9

## 2015-07-13 ENCOUNTER — Ambulatory Visit (INDEPENDENT_AMBULATORY_CARE_PROVIDER_SITE_OTHER): Payer: Medicare Other | Admitting: *Deleted

## 2015-07-13 DIAGNOSIS — I480 Paroxysmal atrial fibrillation: Secondary | ICD-10-CM | POA: Diagnosis not present

## 2015-07-13 DIAGNOSIS — Z5181 Encounter for therapeutic drug level monitoring: Secondary | ICD-10-CM | POA: Diagnosis not present

## 2015-07-13 LAB — POCT INR: INR: 3.4

## 2015-07-28 ENCOUNTER — Ambulatory Visit (INDEPENDENT_AMBULATORY_CARE_PROVIDER_SITE_OTHER): Payer: Medicare Other | Admitting: *Deleted

## 2015-07-28 DIAGNOSIS — I480 Paroxysmal atrial fibrillation: Secondary | ICD-10-CM

## 2015-07-28 DIAGNOSIS — Z5181 Encounter for therapeutic drug level monitoring: Secondary | ICD-10-CM | POA: Diagnosis not present

## 2015-07-28 LAB — POCT INR: INR: 2.6

## 2015-08-18 ENCOUNTER — Ambulatory Visit (INDEPENDENT_AMBULATORY_CARE_PROVIDER_SITE_OTHER): Payer: Medicare Other | Admitting: *Deleted

## 2015-08-18 DIAGNOSIS — Z5181 Encounter for therapeutic drug level monitoring: Secondary | ICD-10-CM | POA: Diagnosis not present

## 2015-08-18 DIAGNOSIS — I480 Paroxysmal atrial fibrillation: Secondary | ICD-10-CM | POA: Diagnosis not present

## 2015-08-18 LAB — POCT INR
INR: 2.4
INR: 2.4

## 2015-08-21 ENCOUNTER — Other Ambulatory Visit: Payer: Self-pay | Admitting: Interventional Cardiology

## 2015-09-09 ENCOUNTER — Encounter: Payer: Self-pay | Admitting: Interventional Cardiology

## 2015-09-13 ENCOUNTER — Ambulatory Visit (INDEPENDENT_AMBULATORY_CARE_PROVIDER_SITE_OTHER): Payer: Medicare Other | Admitting: *Deleted

## 2015-09-13 DIAGNOSIS — I480 Paroxysmal atrial fibrillation: Secondary | ICD-10-CM

## 2015-09-13 DIAGNOSIS — Z5181 Encounter for therapeutic drug level monitoring: Secondary | ICD-10-CM | POA: Diagnosis not present

## 2015-09-13 LAB — POCT INR: INR: 2.6

## 2015-09-28 ENCOUNTER — Encounter: Payer: Self-pay | Admitting: Interventional Cardiology

## 2015-09-28 ENCOUNTER — Ambulatory Visit (INDEPENDENT_AMBULATORY_CARE_PROVIDER_SITE_OTHER): Payer: Medicare Other

## 2015-09-28 ENCOUNTER — Ambulatory Visit (INDEPENDENT_AMBULATORY_CARE_PROVIDER_SITE_OTHER): Payer: Medicare Other | Admitting: Interventional Cardiology

## 2015-09-28 VITALS — BP 128/84 | HR 112 | Ht 63.0 in | Wt 220.1 lb

## 2015-09-28 DIAGNOSIS — E785 Hyperlipidemia, unspecified: Secondary | ICD-10-CM

## 2015-09-28 DIAGNOSIS — I2510 Atherosclerotic heart disease of native coronary artery without angina pectoris: Secondary | ICD-10-CM

## 2015-09-28 DIAGNOSIS — Z7901 Long term (current) use of anticoagulants: Secondary | ICD-10-CM | POA: Diagnosis not present

## 2015-09-28 DIAGNOSIS — I5032 Chronic diastolic (congestive) heart failure: Secondary | ICD-10-CM

## 2015-09-28 DIAGNOSIS — I480 Paroxysmal atrial fibrillation: Secondary | ICD-10-CM

## 2015-09-28 DIAGNOSIS — I10 Essential (primary) hypertension: Secondary | ICD-10-CM

## 2015-09-28 DIAGNOSIS — Z5181 Encounter for therapeutic drug level monitoring: Secondary | ICD-10-CM

## 2015-09-28 LAB — POCT INR: INR: 1.4

## 2015-09-28 MED ORDER — FUROSEMIDE 40 MG PO TABS: 40.0000 mg | ORAL_TABLET | Freq: Every day | ORAL | 11 refills | Status: DC

## 2015-09-28 NOTE — Progress Notes (Signed)
Cardiology Office Note    Date:  09/28/2015   ID:  Kathy Montgomery, DOB 04-Nov-1945, MRN [id number]  PCP:  Kandice Hams, MD  Cardiologist: Sinclair Grooms, MD   Chief Complaint  Patient presents with  . Atrial Fibrillation  . Congestive Heart Failure    History of Present Illness:  Kathy Montgomery is a 70 y.o. female who is here for follow-up of paroxysmal atrial fibrillation and chronic anticoagulation therapy.  Over the past 4-5 months she has had occasional wheezing, lower extremity swelling, and mild exertional dyspnea. Otherwise there are no complaints. She has had no bleeding on Coumadin. She denies palpitations including at today's visit when she arrived. When she arrived the baseline EKG demonstrated atrial fibrillation with verbal ventricular response and an average heart rate of 112 bpm.  Recently went on a vacation but did not take her Coumadin.  Past Medical History:  Diagnosis Date  . Atrial fibrillation (Early) 11/14/2012  . Hypercholesteremia   . Hypertension   . Long term (current) use of anticoagulants 12/17/2012   Atrial fibrillation   . MI (myocardial infarction) [facility]     Past Surgical History:  Procedure Laterality Date  . ECTOPIC PREGNANCY SURGERY      Current Medications: Outpatient Medications Prior to Visit  Medication Sig Dispense Refill  . amLODipine (NORVASC) 5 MG tablet Take 5 mg by mouth at bedtime.     Marland Kitchen atorvastatin (LIPITOR) 40 MG tablet Take 40 mg by mouth at bedtime.    Marland Kitchen warfarin (COUMADIN) 5 MG tablet TAKE 1 TABLET BY MOUTH AS DIRECTED 90 tablet 1  . furosemide (LASIX) 40 MG tablet Take 40 mg by mouth. 1/2 tablet daily    . losartan-hydrochlorothiazide (HYZAAR) 50-12.5 MG per tablet Take 1 tablet by mouth at bedtime.     . Multiple Vitamins-Minerals (MULTIVITAMIN GUMMIES ADULTS) CHEW Chew 2 tablets by mouth at bedtime.      No facility-administered medications prior to visit.      Allergies:   Review of patient's allergies  indicates no known allergies.   Social History   Social History  . Marital status: Single    Spouse name: N/A  . Number of children: N/A  . Years of education: N/A   Social History Main Topics  . Smoking status: Former Smoker    Types: Cigarettes    Quit date: 05/17/2000  . Smokeless tobacco: None  . Alcohol use None  . Drug use: Unknown  . Sexual activity: No   Other Topics Concern  . None   Social History Narrative  . None     Family History:  The patient's family history includes Esophageal cancer in her sister; Stroke in her mother.   ROS:   Please see the history of present illness.    Fluid retention. States that others complain/noticed that she is wheezing.  All other systems reviewed and are negative.   PHYSICAL EXAM:   VS:  BP 128/84   Pulse (!) 112   Ht 5\' 3"  (1.6 m)   Wt 220 lb 1.9 oz (99.8 kg)   BMI 38.99 kg/m    GEN: Well nourished, well developed, in no acute distress  HEENT: normal  Neck: no JVD, carotid bruits, or masses Cardiac: RRR with clinical findings compatible with normal sinus rhythm. No murmurs, rubs, or gallops,no edema . Respiratory:  clear to auscultation bilaterally, normal work of breathing GI: soft, nontender, nondistended, + BS MS: no deformity or atrophy  Skin: warm and  dry, no rash Neuro:  Alert and Oriented x 3, Strength and sensation are intact Psych: euthymic mood, full affect  Wt Readings from Last 3 Encounters:  09/28/15 220 lb 1.9 oz (99.8 kg)  09/06/14 216 lb (98 kg)  06/09/14 209 lb 1.9 oz (94.9 kg)      Studies/Labs Reviewed:   EKG:  EKG  Atrial fibrillation with rapid ventricular response. Right axis is noted.  Recent Labs: No results found for requested labs within last 8760 hours.   Lipid Panel    Component Value Date/Time   CHOL 158 05/09/2007 2043   TRIG 97 05/09/2007 2043   HDL 45 05/09/2007 2043   CHOLHDL 3.5 Ratio 05/09/2007 2043   VLDL 19 05/09/2007 2043   LDLCALC 94 05/09/2007 2043     Additional studies/ records that were reviewed today include:  None.    ASSESSMENT:    1. Coronary artery disease involving native coronary artery of native heart without angina pectoris   2. HYPERTENSION, BENIGN SYSTEMIC   3. Paroxysmal atrial fibrillation (HCC)   4. Chronic diastolic heart failure ([place])   5. Hyperlipidemia   6. Chronic anticoagulation      PLAN:  In order of problems listed above:  1. No documented history of CAD by cath in 2003. 2. Blood pressures well controlled today. Low salt diet. 3. Was in atrial fibrillation when the ECG was performed but by the time of exam she had returned to sinus rhythm. She has a history of paroxysmal atrial fibrillation and is currently demonstrating that she goes back and forth. 4. Today's exam and history suggests acute on chronic diastolic heart failure with volume overload. This peripheral edema. Yesterday she was wheezing. Because of this she took 40 mg of furosemide and has diuresed greater than 3 pounds. Wheezing is resolved. Lower extremity swelling is significantly improved. For this reason I will increase furosemide to 40 mg daily. We will check a basic metabolic panel in one week. She will confirm whether she is on plane losartan or losartan HCT. 5. Continue Coumadin therapy.    Medication Adjustments/Labs and Tests Ordered: Current medicines are reviewed at length with the patient today.  Concerns regarding medicines are outlined above.  Medication changes, Labs and Tests ordered today are listed in the Patient Instructions below. Patient Instructions  Medication Instructions:  Your physician has recommended you make the following change in your medication:  INCREASE Lasix to 40mg  daily. An Rx has been sent to your pharmacy    Labwork: Your physician recommends that you return for lab work in: 1 week (Bmet, Bnp)   Testing/Procedures: None ordered  Follow-Up: Your physician recommends that you schedule a  follow-up appointment in: 6-8 weeks with Dr.Smith   Any Other Special Instructions Will Be Listed Below (If Applicable).    If you need a refill on your cardiac medications before your next appointment, please call your pharmacy.      Signed, Sinclair Grooms, MD  09/28/2015 5:11 PM    [facility] [address], [place], [place]  [address] Phone: [phone]; Fax: [phone]

## 2015-09-28 NOTE — Patient Instructions (Addendum)
Medication Instructions:  Your physician has recommended you make the following change in your medication:  INCREASE Lasix to 40mg  daily. An Rx has been sent to your pharmacy    Labwork: Your physician recommends that you return for lab work in: 1 week (Bmet, Bnp)   Testing/Procedures: None ordered  Follow-Up: Your physician recommends that you schedule a follow-up appointment in: 6-8 weeks with Dr.Smith   Any Other Special Instructions Will Be Listed Below (If Applicable).    If you need a refill on your cardiac medications before your next appointment, please call your pharmacy.

## 2015-10-04 ENCOUNTER — Telehealth: Payer: Self-pay | Admitting: Interventional Cardiology

## 2015-10-04 NOTE — Telephone Encounter (Signed)
Discussed Dr Thompson Caul recommendations with pt.

## 2015-10-04 NOTE — Telephone Encounter (Signed)
Continue furosemide 40 mg daily. Await BMET result.

## 2015-10-04 NOTE — Telephone Encounter (Signed)
New Message  Pt c/o medication issue:  1. Name of Medication: Losartan  2. How are you currently taking this medication (dosage and times per day)? 50 mg  3. Are you having a reaction (difficulty breathing--STAT)? No  4. What is your medication issue? Pt voiced she does not take the HCTZ "50/12.5".

## 2015-10-04 NOTE — Telephone Encounter (Signed)
Pt states she is taking losartan 50mg  daily not losartan/HCT. Pt advised I will forward to Dr Tamala Julian for review.

## 2015-10-05 ENCOUNTER — Other Ambulatory Visit: Payer: Medicare Other

## 2015-10-08 ENCOUNTER — Other Ambulatory Visit: Payer: Medicare Other | Admitting: *Deleted

## 2015-10-08 ENCOUNTER — Ambulatory Visit (INDEPENDENT_AMBULATORY_CARE_PROVIDER_SITE_OTHER): Payer: Medicare Other | Admitting: *Deleted

## 2015-10-08 DIAGNOSIS — I480 Paroxysmal atrial fibrillation: Secondary | ICD-10-CM

## 2015-10-08 DIAGNOSIS — Z5181 Encounter for therapeutic drug level monitoring: Secondary | ICD-10-CM | POA: Diagnosis not present

## 2015-10-08 LAB — POCT INR: INR: 2.4

## 2015-10-08 LAB — BASIC METABOLIC PANEL
BUN: 10 mg/dL (ref 7–25)
CALCIUM: 9.8 mg/dL (ref 8.6–10.4)
CHLORIDE: 106 mmol/L (ref 98–110)
CO2: 27 mmol/L (ref 20–31)
CREATININE: 0.99 mg/dL (ref 0.50–0.99)
Glucose, Bld: 108 mg/dL — ABNORMAL HIGH (ref 65–99)
Potassium: 4.2 mmol/L (ref 3.5–5.3)
SODIUM: 140 mmol/L (ref 135–146)

## 2015-10-08 LAB — BRAIN NATRIURETIC PEPTIDE: Brain Natriuretic Peptide: 28.9 pg/mL (ref <100)

## 2015-10-12 ENCOUNTER — Ambulatory Visit
Admission: RE | Admit: 2015-10-12 | Discharge: 2015-10-12 | Disposition: A | Discharge status: Home / Self Care | Payer: Medicare Other | Source: Ambulatory Visit

## 2015-10-12 DIAGNOSIS — Z1231 Encounter for screening mammogram for malignant neoplasm of breast: Secondary | ICD-10-CM

## 2015-10-14 ENCOUNTER — Other Ambulatory Visit: Payer: Self-pay | Admitting: Obstetrics and Gynecology

## 2015-10-14 DIAGNOSIS — R928 Other abnormal and inconclusive findings on diagnostic imaging of breast: Secondary | ICD-10-CM

## 2015-10-21 ENCOUNTER — Other Ambulatory Visit: Payer: Self-pay | Admitting: Obstetrics and Gynecology

## 2015-10-21 ENCOUNTER — Ambulatory Visit
Admission: RE | Admit: 2015-10-21 | Discharge: 2015-10-21 | Disposition: A | Discharge status: Home / Self Care | Payer: Medicare Other | Source: Ambulatory Visit | Attending: Obstetrics and Gynecology | Admitting: Obstetrics and Gynecology

## 2015-10-21 DIAGNOSIS — R928 Other abnormal and inconclusive findings on diagnostic imaging of breast: Secondary | ICD-10-CM

## 2015-10-22 ENCOUNTER — Other Ambulatory Visit: Payer: Self-pay | Admitting: Obstetrics and Gynecology

## 2015-10-22 ENCOUNTER — Ambulatory Visit
Admission: RE | Admit: 2015-10-22 | Discharge: 2015-10-22 | Disposition: A | Discharge status: Home / Self Care | Payer: Medicare Other | Source: Ambulatory Visit | Attending: Obstetrics and Gynecology | Admitting: Obstetrics and Gynecology

## 2015-10-22 DIAGNOSIS — R921 Mammographic calcification found on diagnostic imaging of breast: Secondary | ICD-10-CM

## 2015-10-22 DIAGNOSIS — R928 Other abnormal and inconclusive findings on diagnostic imaging of breast: Secondary | ICD-10-CM

## 2015-10-29 ENCOUNTER — Ambulatory Visit (INDEPENDENT_AMBULATORY_CARE_PROVIDER_SITE_OTHER): Payer: Medicare Other | Admitting: Pharmacist

## 2015-10-29 ENCOUNTER — Encounter: Payer: Self-pay | Admitting: Interventional Cardiology

## 2015-10-29 DIAGNOSIS — I480 Paroxysmal atrial fibrillation: Secondary | ICD-10-CM

## 2015-10-29 DIAGNOSIS — Z5181 Encounter for therapeutic drug level monitoring: Secondary | ICD-10-CM

## 2015-10-29 LAB — POCT INR: INR: 2.3

## 2015-11-11 ENCOUNTER — Encounter: Payer: Self-pay | Admitting: Interventional Cardiology

## 2015-11-11 ENCOUNTER — Ambulatory Visit (INDEPENDENT_AMBULATORY_CARE_PROVIDER_SITE_OTHER): Payer: Medicare Other | Admitting: Interventional Cardiology

## 2015-11-11 VITALS — BP 132/84 | HR 77 | Ht 63.0 in | Wt 221.8 lb

## 2015-11-11 DIAGNOSIS — I10 Essential (primary) hypertension: Secondary | ICD-10-CM

## 2015-11-11 DIAGNOSIS — Z7901 Long term (current) use of anticoagulants: Secondary | ICD-10-CM | POA: Diagnosis not present

## 2015-11-11 DIAGNOSIS — I5032 Chronic diastolic (congestive) heart failure: Secondary | ICD-10-CM | POA: Diagnosis not present

## 2015-11-11 DIAGNOSIS — E785 Hyperlipidemia, unspecified: Secondary | ICD-10-CM

## 2015-11-11 DIAGNOSIS — I480 Paroxysmal atrial fibrillation: Secondary | ICD-10-CM | POA: Diagnosis not present

## 2015-11-11 NOTE — Progress Notes (Signed)
Cardiology Office Note    Date:  11/11/2015   ID:  Kathy Montgomery, DOB December 21, 1945, MRN [id number]  PCP:  Kandice Hams, MD  Cardiologist: Sinclair Grooms, MD   Chief Complaint  Patient presents with  . Atrial Fibrillation    History of Present Illness:  Kathy Montgomery is a 70 y.o. female who is here for follow-up of paroxysmal atrial fibrillation and chronic anticoagulation therapy.  Since starting furosemide, dyspnea significantly improved. Lower extremity edema however is continuing. No palpitations or significant clinical issues related to atrial fib.   Past Medical History:  Diagnosis Date  . Atrial fibrillation ([place]) 11/14/2012  . Hypercholesteremia   . Hypertension   . Long term (current) use of anticoagulants 12/17/2012   Atrial fibrillation   . MI (myocardial infarction) [facility])     Past Surgical History:  Procedure Laterality Date  . ECTOPIC PREGNANCY SURGERY      Current Medications: Outpatient Medications Prior to Visit  Medication Sig Dispense Refill  . amLODipine (NORVASC) 5 MG tablet Take 5 mg by mouth at bedtime.     Marland Kitchen atorvastatin (LIPITOR) 40 MG tablet Take 40 mg by mouth at bedtime.    . furosemide (LASIX) 40 MG tablet Take 1 tablet (40 mg total) by mouth daily. 30 tablet 11  . losartan (COZAAR) 50 MG tablet Take 50 mg by mouth daily. QD  4  . warfarin (COUMADIN) 5 MG tablet TAKE 1 TABLET BY MOUTH AS DIRECTED 90 tablet 1   No facility-administered medications prior to visit.      Allergies:   Review of patient's allergies indicates no known allergies.   Social History   Social History  . Marital status: Single    Spouse name: N/A  . Number of children: N/A  . Years of education: N/A   Social History Main Topics  . Smoking status: Former Smoker    Types: Cigarettes    Quit date: 05/17/2000  . Smokeless tobacco: Never Used  . Alcohol use No  . Drug use: No  . Sexual activity: No   Other Topics Concern  . None   Social History  Narrative  . None     Family History:  The patient's family history includes Esophageal cancer in her sister; Stroke in her mother.   ROS:   Please see the history of present illness.    Continue lower extremity swelling, unexplained weight gain, occasional palpitations.  All other systems reviewed and are negative.   PHYSICAL EXAM:   VS:  BP 132/84   Pulse 77   Ht _0  (1.6 m)   Wt 221 lb 12.8 oz (100.6 kg)   BMI 39.29 kg/m    GEN: Well nourished, well developed, in no acute distress  HEENT: normal  Neck: no JVD, carotid bruits, or masses Cardiac: RRR; no murmurs, rubs, or gallops,no edema  Respiratory:  clear to auscultation bilaterally, normal work of breathing GI: soft, nontender, nondistended, + BS MS: no deformity or atrophy  Skin: warm and dry, no rash Neuro:  Alert and Oriented x 3, Strength and sensation are intact Psych: euthymic mood, full affect  Wt Readings from Last 3 Encounters:  11/11/15 221 lb 12.8 oz (100.6 kg)  09/28/15 220 lb 1.9 oz (99.8 kg)  09/06/14 216 lb (98 kg)      Studies/Labs Reviewed:   EKG:  EKG  Not repeated  Recent Labs: 10/08/2015: Brain Natriuretic Peptide 28.9; BUN 10; Creat 0.99; Potassium 4.2; Sodium 140  Lipid Panel    Component Value Date/Time   CHOL 158 05/09/2007 2043   TRIG 97 05/09/2007 2043   HDL 45 05/09/2007 2043   CHOLHDL 3.5 Ratio 05/09/2007 2043   VLDL 19 05/09/2007 2043   LDLCALC 94 05/09/2007 2043    Additional studies/ records that were reviewed today include:  No data other than August 25 met that was reviewed with the patient. It is noted above. This is after furosemide was started.    ASSESSMENT:    1. Paroxysmal atrial fibrillation (HCC)   2. Chronic diastolic heart failure ([place])   3. HYPERTENSION, BENIGN SYSTEMIC   4. Chronic anticoagulation   5. Hyperlipidemia      PLAN:  In order of problems listed above:  1. Clinically in sinus rhythm today 2. Clear lung fields and no evidence of  volume overload with reference to heart and lungs. She still has bilateral lower extremity edema. I believe the edema may be related to amlodipine. 3. Excellent blood pressure control. We'll stop amlodipine. 2 week follow-up and if blood pressure is on acceptable, i.e. greater than 140/90, increase losartan to 100 mg per day. She will be seen by APP. 4. No bleeding complications.    Medication Adjustments/Labs and Tests Ordered: Current medicines are reviewed at length with the patient today.  Concerns regarding medicines are outlined above.  Medication changes, Labs and Tests ordered today are listed in the Patient Instructions below. Patient Instructions  Medication Instructions:  None  Labwork: None  Testing/Procedures: None  Follow-Up: Your physician recommends that you schedule a follow-up appointment in: 2-4 weeks with a PA/NP.  Your physician wants you to follow-up in: 6 months with Dr. Tamala Julian. You will receive a reminder letter in the mail two months in advance. If you don't receive a letter, please call our office to schedule the follow-up appointment.    Any Other Special Instructions Will Be Listed Below (If Applicable).     If you need a refill on your cardiac medications before your next appointment, please call your pharmacy.      Signed, Sinclair Grooms, MD  11/11/2015 11:46 AM    [facility] [address], [place], [place]  [address] Phone: [phone]; Fax: [phone]

## 2015-11-11 NOTE — Patient Instructions (Addendum)
Medication Instructions:  1) STOP Amlodipine  Labwork: None  Testing/Procedures: None  Follow-Up: Your physician recommends that you schedule a follow-up appointment in: 2-4 weeks with a PA/NP.  Your physician wants you to follow-up in: 6 months with Dr. Tamala Julian. You will receive a reminder letter in the mail two months in advance. If you don't receive a letter, please call our office to schedule the follow-up appointment.    Any Other Special Instructions Will Be Listed Below (If Applicable).     If you need a refill on your cardiac medications before your next appointment, please call your pharmacy.

## 2015-12-09 ENCOUNTER — Ambulatory Visit: Payer: Medicare Other | Admitting: Cardiology

## 2015-12-12 NOTE — Progress Notes (Signed)
Cardiology Office Note   Date:  12/13/2015   ID:  Kathy Montgomery, DOB 1945-10-31, MRN [id number]  PCP:  Kandice Hams, MD  Cardiologist: Dr. Tamala Julian    Chief Complaint  Patient presents with  . Leg Swelling    now with changes in meds.      History of Present Illness: Kathy Montgomery is a 70 y.o. female who presents for recheck of lower ext edema after amlodipine has been stopped.    She has a hx. Of paroxysmal atrial fibrillation and chronic anticoagulation therapy.  Since starting furosemide, dyspnea significantly improved. Lower extremity edema however is continuing. No palpitations or significant clinical issues related to atrial fib. With lower ext edema her amlodipine was stopped.  Plan to check BP today and increase losartan to 100 mg if needed.    Today her BP is elevated.  She continues with mild edema of lower ext, improved from previous.  In SR today.  She has coumadin clinic today as well. No chest pain or SOb.   Past Medical History:  Diagnosis Date  . Atrial fibrillation ([place]) 11/14/2012  . Hypercholesteremia   . Hypertension   . Long term (current) use of anticoagulants 12/17/2012   Atrial fibrillation   . MI (myocardial infarction)     Past Surgical History:  Procedure Laterality Date  . ECTOPIC PREGNANCY SURGERY       Current Outpatient Prescriptions  Medication Sig Dispense Refill  . atorvastatin (LIPITOR) 40 MG tablet Take 40 mg by mouth at bedtime.    . furosemide (LASIX) 40 MG tablet Take 1 tablet (40 mg total) by mouth daily. 30 tablet 11  . losartan (COZAAR) 50 MG tablet Take 50 mg by mouth daily. QD  4  . warfarin (COUMADIN) 5 MG tablet TAKE 1 TABLET BY MOUTH AS DIRECTED 90 tablet 1   No current facility-administered medications for this visit.     Allergies:   Review of patient's allergies indicates no known allergies.    Social History:  The patient  reports that she quit smoking about 15 years ago. Her smoking use included  Cigarettes. She has never used smokeless tobacco. She reports that she does not drink alcohol or use drugs.   Family History:  The patient's family history includes Esophageal cancer in her sister; Stroke in her mother.    ROS:  General:no colds or fevers, no weight changes Skin:no rashes or ulcers HEENT:no blurred vision, + congestion, has clear nasal secretions. CV:see HPI PUL:see HPI GI:no diarrhea constipation or melena, no indigestion GU:no hematuria, no dysuria MS:no joint pain, no claudication Neuro:no syncope, no lightheadedness Endo:no diabetes, no thyroid disease  Wt Readings from Last 3 Encounters:  12/13/15 222 lb 12.8 oz (101.1 kg)  11/11/15 221 lb 12.8 oz (100.6 kg)  09/28/15 220 lb 1.9 oz (99.8 kg)     PHYSICAL EXAM: VS:  BP (!) 150/90   Pulse 68   Ht 5\' 3"  (1.6 m)   Wt 222 lb 12.8 oz (101.1 kg)   SpO2 99%   BMI 39.47 kg/m  , BMI Body mass index is 39.47 kg/m. General:Pleasant affect, NAD Skin:Warm and dry, brisk capillary refill HEENT:normocephalic, sclera clear, mucus membranes moist Neck:supple, no JVD, no bruits  Heart:S1S2 RRR without murmur, gallup, rub or click Lungs:clear without rales, rhonchi, occ wheezes [id number], non tender, + BS, do not palpate liver spleen or masses Ext:tr to 1+ lower ext edema, 2+ pedal pulses, 2+ radial pulses Neuro:alert and oriented, MAE, follows commands, +  facial symmetry    EKG:  EKG is ordered today. The ekg ordered today demonstrates SR at 68 possible Lt atrial enlargement.  No acute changes   Recent Labs: 10/08/2015: Brain Natriuretic Peptide 28.9; BUN 10; Creat 0.99; Potassium 4.2; Sodium 140    Lipid Panel    Component Value Date/Time   CHOL 158 05/09/2007 2043   TRIG 97 05/09/2007 2043   HDL 45 05/09/2007 2043   CHOLHDL 3.5 Ratio 05/09/2007 2043   VLDL 19 05/09/2007 2043   LDLCALC 94 05/09/2007 2043       Other studies Reviewed: Additional studies/ records that were reviewed today include:  previous notes..   ASSESSMENT AND PLAN:   1. Sinus rhythm today on EKG 2. No rales, no evidence of volume overload with reference to heart and lungs. She still has bilateral lower extremity edema but improved off amlodipine, BP elevated will increase losartan as mentioned by Dr. Tamala Julian 3. Essential HTN . wlwvated today increase losartan 100 mg daily. She will be seen by APP in 3-4 weeks for recheck and BMP in 1 week. 4. No bleeding complications.- for coumadin check today. 5. Congestion but occ wheeze- it has been over year since CXR will check and then prescribe her pro air that helped her last year. She is on mucinex -viral no fevers. URI.   Current medicines are reviewed with the patient today.  The patient Has no concerns regarding medicines.  The following changes have been made:  See above Labs/ tests ordered today include:see above  Disposition:   FU:  see above  Signed, Cecilie Kicks, NP  12/13/2015 8:43 AM    [facility], [place], [place], [place] Phone: [phone]; Fax: [phone]

## 2015-12-13 ENCOUNTER — Encounter: Payer: Self-pay | Admitting: Cardiology

## 2015-12-13 ENCOUNTER — Ambulatory Visit (INDEPENDENT_AMBULATORY_CARE_PROVIDER_SITE_OTHER): Payer: Medicare Other | Admitting: Cardiology

## 2015-12-13 ENCOUNTER — Ambulatory Visit
Admission: RE | Admit: 2015-12-13 | Discharge: 2015-12-13 | Disposition: A | Discharge status: Home / Self Care | Payer: Medicare Other | Source: Ambulatory Visit | Attending: Cardiology | Admitting: Cardiology

## 2015-12-13 ENCOUNTER — Ambulatory Visit (INDEPENDENT_AMBULATORY_CARE_PROVIDER_SITE_OTHER): Payer: Medicare Other | Admitting: Pharmacist

## 2015-12-13 VITALS — BP 150/90 | HR 68 | Ht 63.0 in | Wt 222.8 lb

## 2015-12-13 DIAGNOSIS — I10 Essential (primary) hypertension: Secondary | ICD-10-CM

## 2015-12-13 DIAGNOSIS — I5032 Chronic diastolic (congestive) heart failure: Secondary | ICD-10-CM

## 2015-12-13 DIAGNOSIS — B9789 Other viral agents as the cause of diseases classified elsewhere: Secondary | ICD-10-CM

## 2015-12-13 DIAGNOSIS — I480 Paroxysmal atrial fibrillation: Secondary | ICD-10-CM

## 2015-12-13 DIAGNOSIS — E7800 Pure hypercholesterolemia, unspecified: Secondary | ICD-10-CM | POA: Insufficient documentation

## 2015-12-13 DIAGNOSIS — E789 Disorder of lipoprotein metabolism, unspecified: Secondary | ICD-10-CM | POA: Insufficient documentation

## 2015-12-13 DIAGNOSIS — R600 Localized edema: Secondary | ICD-10-CM | POA: Diagnosis not present

## 2015-12-13 DIAGNOSIS — Z5181 Encounter for therapeutic drug level monitoring: Secondary | ICD-10-CM

## 2015-12-13 DIAGNOSIS — J069 Acute upper respiratory infection, unspecified: Secondary | ICD-10-CM

## 2015-12-13 DIAGNOSIS — Z7901 Long term (current) use of anticoagulants: Secondary | ICD-10-CM

## 2015-12-13 LAB — POCT INR: INR: 2.1

## 2015-12-13 MED ORDER — ALBUTEROL SULFATE HFA 108 (90 BASE) MCG/ACT IN AERS: 2.0000 | INHALATION_SPRAY | Freq: Four times a day (QID) | RESPIRATORY_TRACT | Status: AC | PRN

## 2015-12-13 MED ORDER — LOSARTAN POTASSIUM 100 MG PO TABS: 100.0000 mg | ORAL_TABLET | Freq: Every day | ORAL | 3 refills | Status: DC

## 2015-12-13 NOTE — Patient Instructions (Addendum)
Increase Losartan to 100 mg daily   Lab work Paramedic ) in 1 week 12/20/15 Come to lab at [facility]  7:30 am to 5:00 pm   Chest Xray today at [facility] 2 puffs every 6 hours as needed   Your physician recommends that you schedule a follow-up appointment in: 1 month with Cecilie Kicks PA  Tues 01/11/16 at 9:00 am

## 2015-12-20 ENCOUNTER — Telehealth: Payer: Self-pay | Admitting: Interventional Cardiology

## 2015-12-20 ENCOUNTER — Other Ambulatory Visit: Payer: Medicare Other

## 2015-12-20 NOTE — Telephone Encounter (Signed)
DUPLICATE, SEE PHONE ENCOUNTER FROM 12/17/15.

## 2015-12-20 NOTE — Telephone Encounter (Signed)
Jeanella Ordonez is calling about the lab work , and wants to know exactly what labs she is getting . She has a question about it . Please call

## 2015-12-20 NOTE — Telephone Encounter (Signed)
Pt called back. She is asking what labs will be checked 12/22/15. I advised BMET and reviewed individual components of BMET. She voiced understanding and satisfaction.

## 2015-12-20 NOTE — Telephone Encounter (Signed)
LMTCB

## 2015-12-20 NOTE — Telephone Encounter (Signed)
New message  Pt returning call  Re: lab work  Please call back

## 2015-12-22 ENCOUNTER — Other Ambulatory Visit: Payer: Medicare Other | Admitting: *Deleted

## 2015-12-22 DIAGNOSIS — I5032 Chronic diastolic (congestive) heart failure: Secondary | ICD-10-CM

## 2015-12-22 DIAGNOSIS — I10 Essential (primary) hypertension: Secondary | ICD-10-CM

## 2015-12-22 LAB — BASIC METABOLIC PANEL
BUN: 10 mg/dL (ref 7–25)
CALCIUM: 9.6 mg/dL (ref 8.6–10.4)
CHLORIDE: 107 mmol/L (ref 98–110)
CO2: 26 mmol/L (ref 20–31)
CREATININE: 1 mg/dL — AB (ref 0.50–0.99)
Glucose, Bld: 109 mg/dL — ABNORMAL HIGH (ref 65–99)
Potassium: 4.1 mmol/L (ref 3.5–5.3)
Sodium: 140 mmol/L (ref 135–146)

## 2015-12-24 ENCOUNTER — Ambulatory Visit (INDEPENDENT_AMBULATORY_CARE_PROVIDER_SITE_OTHER): Payer: Medicare Other

## 2015-12-24 ENCOUNTER — Encounter: Payer: Self-pay | Admitting: Podiatry

## 2015-12-24 ENCOUNTER — Ambulatory Visit (INDEPENDENT_AMBULATORY_CARE_PROVIDER_SITE_OTHER): Payer: Medicare Other | Admitting: Podiatry

## 2015-12-24 DIAGNOSIS — M79671 Pain in right foot: Secondary | ICD-10-CM

## 2015-12-24 DIAGNOSIS — R52 Pain, unspecified: Secondary | ICD-10-CM | POA: Diagnosis not present

## 2015-12-24 DIAGNOSIS — M84374A Stress fracture, right foot, initial encounter for fracture: Secondary | ICD-10-CM

## 2015-12-24 DIAGNOSIS — M779 Enthesopathy, unspecified: Secondary | ICD-10-CM | POA: Diagnosis not present

## 2015-12-24 NOTE — Progress Notes (Signed)
   Subjective:    Patient ID: Kathy Montgomery, female    DOB: Sep 06, 1945, 70 y.o.   MRN: [id number]  HPI  70 year old female presents the office if concerns her right foot pain which is been ongoing for about 2 weeks. She states that she got out of the chair G Binder toes back and she felt like some been cracked in her toes that she broke a bone. The pain has gotten somewhat better the last couple weeks which she points submetatarsal 2 wjere she has the majority pain. She states this and not hurt on the top of her foot on the bottom and in the ball of her foot. She's had no recent treatment. No other complaints at this time.   Review of Systems  Hematological: Bruises/bleeds easily.  All other systems reviewed and are negative.      Objective:   Physical Exam General: AAO x3, NAD  Dermatological: Skin is warm, dry and supple bilateral. Nails x 10 are well manicured; remaining integument appears unremarkable at this time. There are no open sores, no preulcerative lesions, no rash or signs of infection present.  Vascular: Dorsalis Pedis artery and Posterior Tibial artery pedal pulses are 2/4 bilateral with immedate capillary fill time. There is no pain with calf compression, swelling, warmth, erythema.   Neruologic: Grossly intact via light touch bilateral. Vibratory intact via tuning fork bilateral. Protective threshold with Semmes Wienstein monofilament intact to all pedal sites bilateral.   Musculoskeletal: There is tenderness on the right foot submetatarsal 2 and along the MPJs 1 through 3. There is no specific area pinpoint printers is no pain vibratory sensation. The majority tenderness is on the plantar aspect and there is no tenderness in the dorsal aspect. There is no overlying edema, erythema, increase in warmth. There is no pain with MPJ range of motion today. No other areas of tenderness bilaterally.  Gait: Unassisted, Nonantalgic.     Assessment & Plan:  70 year old female  right foot pain likely capsulitis, rule out fracture -Treatment options discussed including all alternatives, risks, and complications -Etiology of symptoms were discussed -X-rays were obtained and reviewed with the patient. Unable to appreciate any evidence of acute fracture. -Discussed treatment options with her however she states that she feels that her foot is still broken. Because of this into place into a surgical shoe today. Recommended ice and elevation. A metatarsal pad to the surgical shoe. I'll see her back in 2 weeks and repeat the x-rays. That point if they're negative we will proceed with further treatment. Continue ice and elevation as well. Limited activity.  Celesta Gentile, DPM

## 2016-01-03 ENCOUNTER — Other Ambulatory Visit: Payer: Self-pay | Admitting: Dermatology

## 2016-01-08 IMAGING — CR DG CHEST 2V
2 series · 2 of 2 positions shown · non-contrast
Comparison: 10/28/2012

CLINICAL DATA: Joint swelling, hand pain, toe pain, history of
myocardial infarction and hypertension

EXAM:
CHEST  2 VIEW

[chest pa]
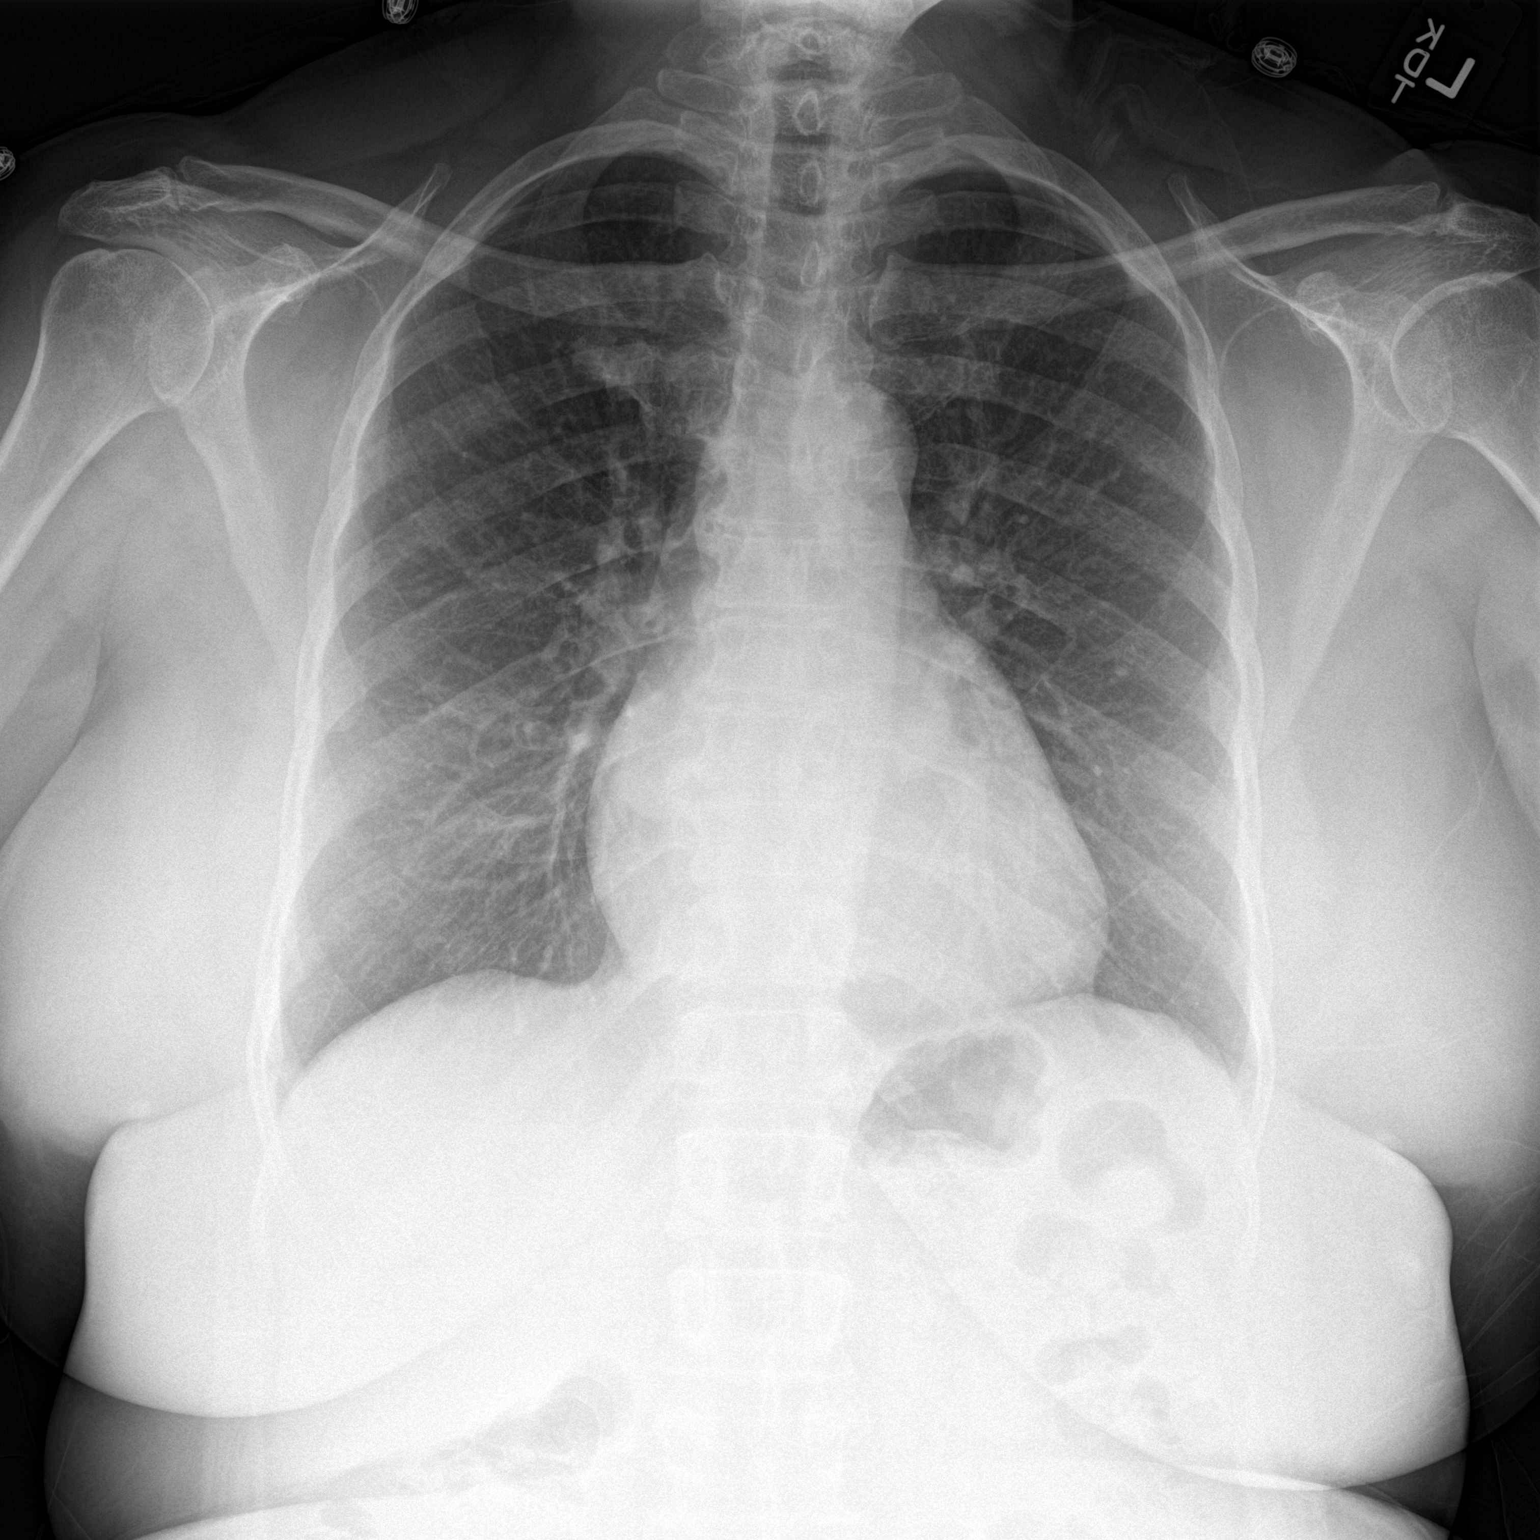

[chest lat]
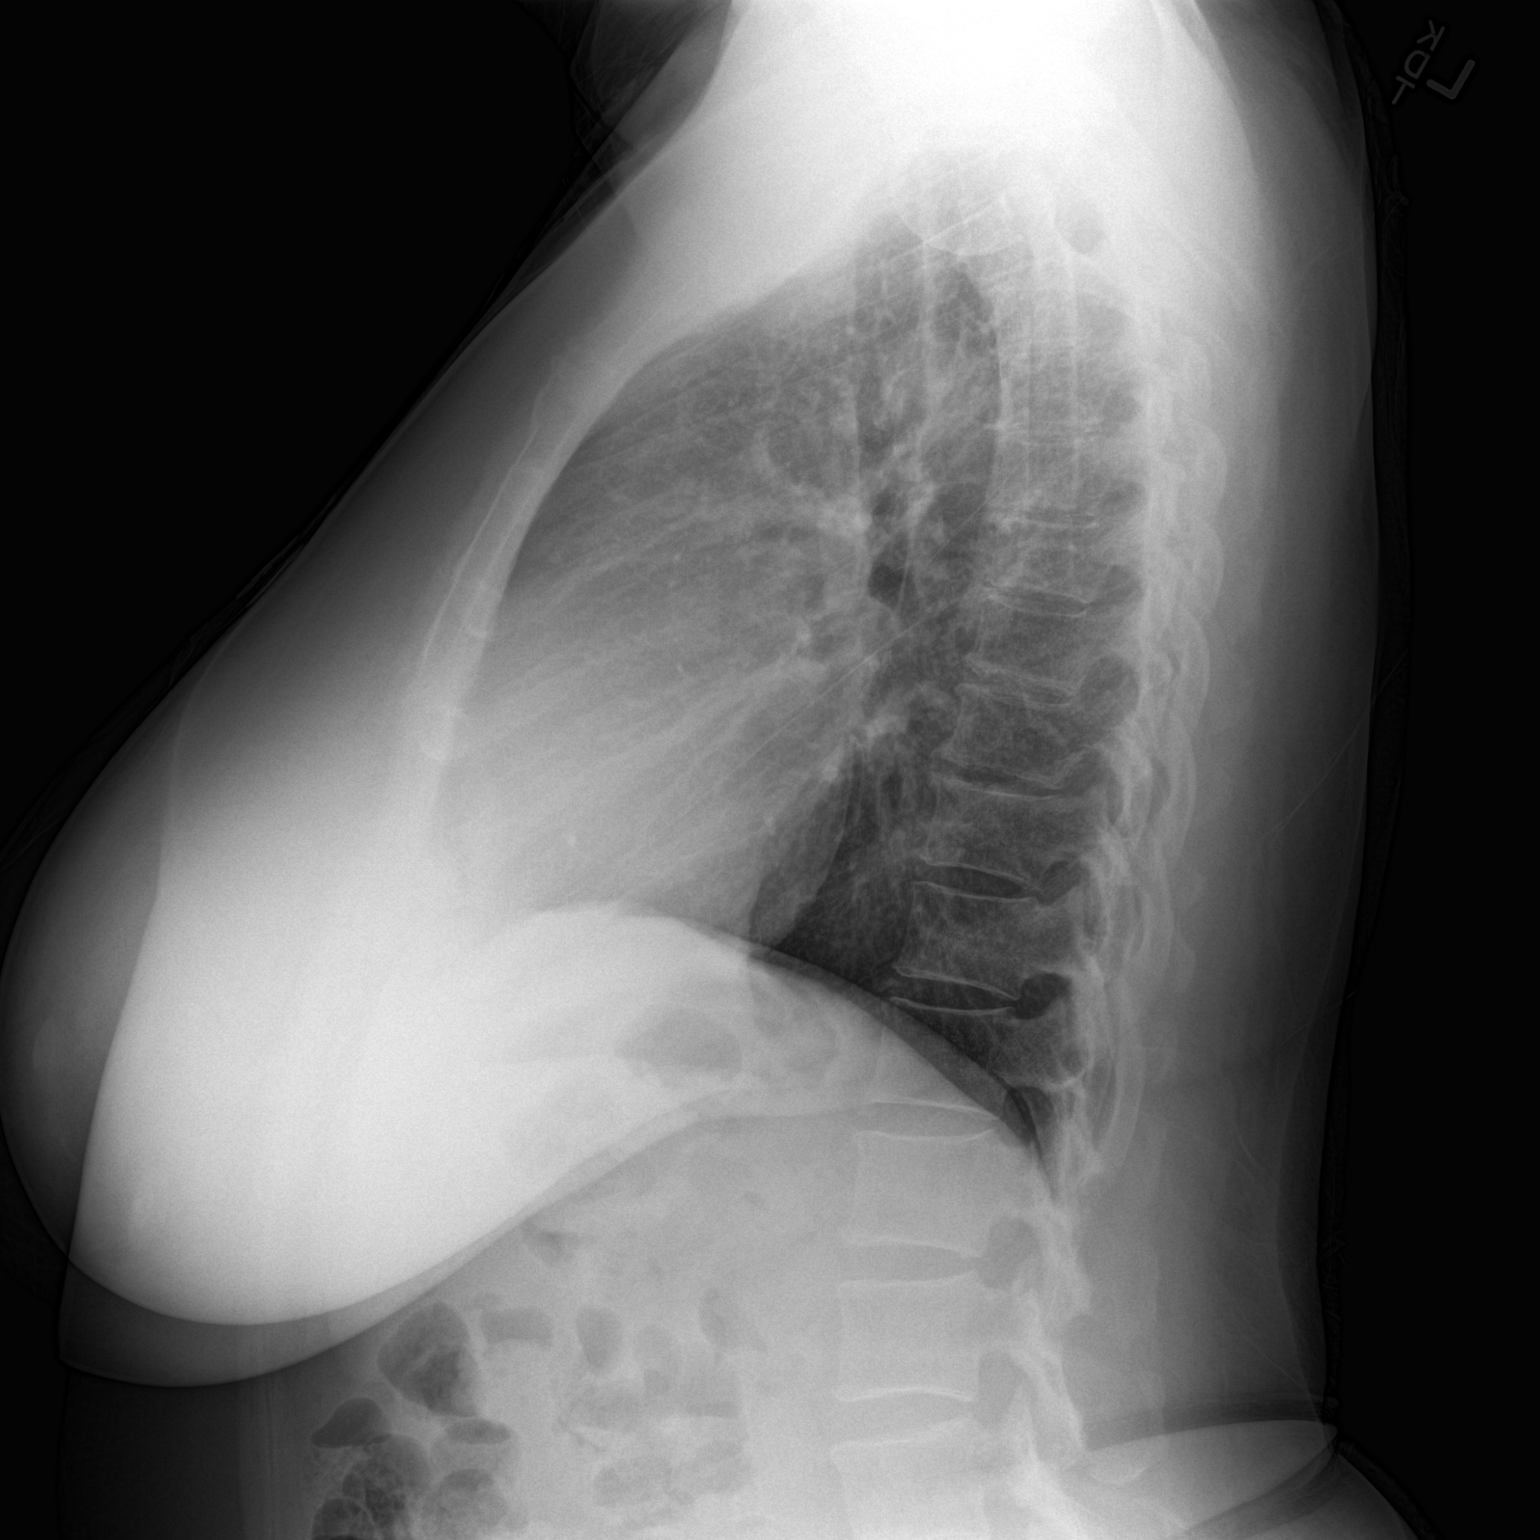

[2 of 2 positions shown; findings below may reference images not displayed]

FINDINGS: Heart size upper normal and stable. Vascular pattern normal. Lungs
clear. No effusions.
IMPRESSION: No active cardiopulmonary disease.

## 2016-01-10 ENCOUNTER — Encounter: Payer: Self-pay | Admitting: Podiatry

## 2016-01-10 ENCOUNTER — Ambulatory Visit (INDEPENDENT_AMBULATORY_CARE_PROVIDER_SITE_OTHER): Payer: Medicare Other

## 2016-01-10 ENCOUNTER — Ambulatory Visit (INDEPENDENT_AMBULATORY_CARE_PROVIDER_SITE_OTHER): Payer: Medicare Other | Admitting: Podiatry

## 2016-01-10 DIAGNOSIS — R52 Pain, unspecified: Secondary | ICD-10-CM

## 2016-01-10 DIAGNOSIS — M79671 Pain in right foot: Secondary | ICD-10-CM

## 2016-01-10 DIAGNOSIS — M779 Enthesopathy, unspecified: Secondary | ICD-10-CM

## 2016-01-10 NOTE — Progress Notes (Signed)
Cardiology Office Note   Date:  01/11/2016   ID:  Kathy Montgomery, DOB 1945/11/27, MRN [id number]  PCP:  Kandice Hams, MD  Cardiologist:  Dr. Tamala Julian    Chief Complaint  Patient presents with  . Leg Swelling  . Hypertension      History of Present Illness: Kathy Montgomery is a 70 y.o. female who presents for recheck of lower ext edema after amlodipine has been stopped.    She has a hx. Of paroxysmal atrial fibrillation and chronic anticoagulation therapy.  Since starting furosemide, dyspnea significantly improved. Lower extremity edema has now improved as well. No palpitations or significant clinical issues related to atrial fib. With lower ext edema her amlodipine was stopped.  On last visit her losartan was increased to 100 mg.  Today her BP is controlled.  Sounds to be in SR on exam.  She has coumadin clinic today as well. No chest pain or SOB. Trace edema today of ankles, she is aware that salt is culprit.     Past Medical History:  Diagnosis Date  . Atrial fibrillation ([place]) 11/14/2012  . Hypercholesteremia   . Hypertension   . Long term (current) use of anticoagulants 12/17/2012   Atrial fibrillation   . MI (myocardial infarction)     Past Surgical History:  Procedure Laterality Date  . ECTOPIC PREGNANCY SURGERY       Current Outpatient Prescriptions  Medication Sig Dispense Refill  . atorvastatin (LIPITOR) 40 MG tablet Take 40 mg by mouth at bedtime.    . furosemide (LASIX) 40 MG tablet Take 1 tablet (40 mg total) by mouth daily. 30 tablet 11  . losartan (COZAAR) 100 MG tablet Take 1 tablet (100 mg total) by mouth daily. 90 tablet 3  . warfarin (COUMADIN) 5 MG tablet TAKE 1 TABLET BY MOUTH AS DIRECTED 90 tablet 1   Current Facility-Administered Medications  Medication Dose Route Frequency Provider Last Rate Last Dose  . albuterol (PROVENTIL HFA;VENTOLIN HFA) 108 (90 Base) MCG/ACT inhaler 2 puff  2 puff Inhalation Q6H PRN Isaiah Serge, NP         Allergies:   Patient has no known allergies.    Social History:  The patient  reports that she quit smoking about 15 years ago. Her smoking use included Cigarettes. She has never used smokeless tobacco. She reports that she does not drink alcohol or use drugs.   Family History:  The patient's family history includes Esophageal cancer in her sister; Stroke in her mother.    ROS:  General:no colds or fevers, + weight loss- she is trying.  Skin:no rashes or ulcers HEENT:no blurred vision, no congestion CV:see HPI PUL:see HPI GI:no diarrhea constipation or melena, no indigestion GU:no hematuria, no dysuria MS:no joint pain, no claudication Neuro:no syncope, no lightheadedness Endo:no diabetes, no thyroid disease She believes she had her flu vaccine and wonders about her PNA vaccine.  She will check with her PCP.  Wt Readings from Last 3 Encounters:  01/11/16 218 lb 4 oz (99 kg)  12/13/15 222 lb 12.8 oz (101.1 kg)  11/11/15 221 lb 12.8 oz (100.6 kg)     PHYSICAL EXAM: VS:  BP 132/82   Pulse 64   Ht 5\' 3"  (1.6 m)   Wt 218 lb 4 oz (99 kg)   BMI 38.66 kg/m  , BMI Body mass index is 38.66 kg/m. General:Pleasant affect, NAD Neck:supple, no JVD, no bruits  Heart:S1S2 RRR without murmur, gallup, rub or click Lungs:clear without  rales, rhonchi, or wheezes Ext:tr lower ext edema just at the ankles, 2+ pedal pulses, 2+ radial pulses Neuro:alert and oriented X 3, MAE, follows commands, + facial symmetry    EKG:  EKG is NOT ordered today.    Recent Labs: 10/08/2015: Brain Natriuretic Peptide 28.9 12/22/2015: BUN 10; Creat 1.00; Potassium 4.1; Sodium 140    Lipid Panel    Component Value Date/Time   CHOL 158 05/09/2007 2043   TRIG 97 05/09/2007 2043   HDL 45 05/09/2007 2043   CHOLHDL 3.5 Ratio 05/09/2007 2043   VLDL 19 05/09/2007 2043   LDLCALC 94 05/09/2007 2043       Other studies Reviewed: Additional studies/ records that were reviewed today include: previous  notes.   ASSESSMENT AND PLAN:  1. Sinus rhythm on exam, regular 2. euvolemic trace lower edema, discussed support stockings.  3. Essential HTN . controlled today after  Increase of losartan 100 mg daily. labs are good.  Follow up with Dr. Tamala Julian in 3 months.  4. No bleeding complications.- for coumadin check today. 5. Congestion but occ wheeze-  CXR recently stable, refilled her pro air that helped her last year. She is on mucinex -viral no fevers. URI. If with pro air continued wheezes may need pulmonary consult.    Current medicines are reviewed with the patient today.  The patient Has no concerns regarding medicines.  The following changes have been made:  See above Labs/ tests ordered today include:see above  Disposition:   FU:  see above  Signed, Cecilie Kicks, NP  01/11/2016 12:00 PM    [facility], [place], [place], [place] Phone: [phone]; Fax: [phone]

## 2016-01-11 ENCOUNTER — Ambulatory Visit (INDEPENDENT_AMBULATORY_CARE_PROVIDER_SITE_OTHER): Payer: Medicare Other | Admitting: *Deleted

## 2016-01-11 ENCOUNTER — Ambulatory Visit (INDEPENDENT_AMBULATORY_CARE_PROVIDER_SITE_OTHER): Payer: Medicare Other | Admitting: Cardiology

## 2016-01-11 ENCOUNTER — Encounter: Payer: Self-pay | Admitting: Cardiology

## 2016-01-11 ENCOUNTER — Other Ambulatory Visit: Payer: Self-pay | Admitting: Interventional Cardiology

## 2016-01-11 VITALS — BP 132/82 | HR 64 | Ht 63.0 in | Wt 218.2 lb

## 2016-01-11 DIAGNOSIS — I10 Essential (primary) hypertension: Secondary | ICD-10-CM

## 2016-01-11 DIAGNOSIS — Z7901 Long term (current) use of anticoagulants: Secondary | ICD-10-CM

## 2016-01-11 DIAGNOSIS — R600 Localized edema: Secondary | ICD-10-CM | POA: Diagnosis not present

## 2016-01-11 DIAGNOSIS — I480 Paroxysmal atrial fibrillation: Secondary | ICD-10-CM

## 2016-01-11 DIAGNOSIS — Z5181 Encounter for therapeutic drug level monitoring: Secondary | ICD-10-CM

## 2016-01-11 DIAGNOSIS — I5032 Chronic diastolic (congestive) heart failure: Secondary | ICD-10-CM | POA: Diagnosis not present

## 2016-01-11 LAB — POCT INR: INR: 2.8

## 2016-01-11 NOTE — Patient Instructions (Signed)
Continue same medications   Your physician recommends that you schedule a follow-up appointment with Dr.Smith Wednesday 03/22/16 at 8:15 am

## 2016-01-12 NOTE — Telephone Encounter (Signed)
With looking at [place] from 01/11/16.  She was agreeable to refill this medication.  Ok to fill under Cecilie Kicks, NP.

## 2016-01-13 NOTE — Progress Notes (Signed)
Subjective: 70 year old female presents the office today for follow-up evaluation of right foot pain. She states the pain has improved since last appointment however she does get some occasional soreness. Offloading pads that helped. She denies any swelling or redness. Denies any recent injury or trauma since last appointment. No numbness or tingling. Denies any systemic complaints such as fevers, chills, nausea, vomiting. No acute changes since last appointment, and no other complaints at this time.   Objective: AAO x3, NAD DP/PT pulses palpable bilaterally, CRT less than 3 seconds There is minimal tenderness palpation of the right foot submetatarsal 2.There is no other areas of pinpoint bony tenderness or pain the vibratory sensation. There is no overlying edema, erythema, increase in warmth. There is no pain with MTPJ range of motion.  No open lesions or pre-ulcerative lesions.  No pain with calf compression, swelling, warmth, erythema  Assessment: 70 year old female right foot capsulitis   Plan: -All treatment options discussed with the patient including all alternatives, risks, complications.  -Repeated x-rays were obtained today which did not reveal any acute fracture. -Recommended continue offloading pads. She'll transition to a regular shoe as tolerated. Continue ice. -Follow up with symptoms not resolve the next 2-3 weeks or sooner if needed or any worsening. -Patient encouraged to call the office with any questions, concerns, change in symptoms.   Celesta Gentile, DPM

## 2016-02-08 ENCOUNTER — Ambulatory Visit (INDEPENDENT_AMBULATORY_CARE_PROVIDER_SITE_OTHER): Payer: Medicare Other | Admitting: *Deleted

## 2016-02-08 DIAGNOSIS — I480 Paroxysmal atrial fibrillation: Secondary | ICD-10-CM | POA: Diagnosis not present

## 2016-02-08 DIAGNOSIS — Z5181 Encounter for therapeutic drug level monitoring: Secondary | ICD-10-CM

## 2016-02-08 LAB — POCT INR: INR: 1.7

## 2016-02-10 ENCOUNTER — Ambulatory Visit: Payer: Medicare Other | Admitting: Podiatry

## 2016-03-06 ENCOUNTER — Ambulatory Visit (INDEPENDENT_AMBULATORY_CARE_PROVIDER_SITE_OTHER): Payer: Medicare Other

## 2016-03-06 DIAGNOSIS — I480 Paroxysmal atrial fibrillation: Secondary | ICD-10-CM | POA: Diagnosis not present

## 2016-03-06 DIAGNOSIS — Z5181 Encounter for therapeutic drug level monitoring: Secondary | ICD-10-CM

## 2016-03-06 DIAGNOSIS — I4891 Unspecified atrial fibrillation: Secondary | ICD-10-CM | POA: Diagnosis not present

## 2016-03-06 LAB — POCT INR: INR: 2.2

## 2016-03-22 ENCOUNTER — Encounter: Payer: Self-pay | Admitting: Interventional Cardiology

## 2016-03-22 ENCOUNTER — Ambulatory Visit (INDEPENDENT_AMBULATORY_CARE_PROVIDER_SITE_OTHER): Payer: Medicare Other | Admitting: Interventional Cardiology

## 2016-03-22 VITALS — BP 140/96 | HR 77 | Ht 63.0 in | Wt 215.8 lb

## 2016-03-22 DIAGNOSIS — I2510 Atherosclerotic heart disease of native coronary artery without angina pectoris: Secondary | ICD-10-CM | POA: Diagnosis not present

## 2016-03-22 DIAGNOSIS — I482 Chronic atrial fibrillation, unspecified: Secondary | ICD-10-CM

## 2016-03-22 DIAGNOSIS — I5032 Chronic diastolic (congestive) heart failure: Secondary | ICD-10-CM

## 2016-03-22 DIAGNOSIS — Z7901 Long term (current) use of anticoagulants: Secondary | ICD-10-CM

## 2016-03-22 DIAGNOSIS — I10 Essential (primary) hypertension: Secondary | ICD-10-CM | POA: Diagnosis not present

## 2016-03-22 MED ORDER — DILTIAZEM HCL ER COATED BEADS 120 MG PO CP24: 120.0000 mg | ORAL_CAPSULE | Freq: Every day | ORAL | 3 refills | Status: DC

## 2016-03-22 NOTE — Patient Instructions (Signed)
Medication Instructions:  Your physician has recommended you make the following change in your medication: START - Diltiazem (Cardizem CD) 120 mg by mouth daily.   Labwork: None ordered  Testing/Procedures: None ordered  Follow-Up: Your physician has requested that you follow-up in BP Clinic in 1 month.  Your physician wants you to follow-up in 9 -12 months with Dr. Tamala Julian.You will receive a reminder letter in the mail two months in advance. If you don't receive a letter, please call our office to schedule the follow-up appointment.  Any Other Special Instructions Will Be Listed Below (If Applicable).  Low-Sodium Eating Plan Sodium raises blood pressure and causes water to be held in the body. Getting less sodium from food will help lower your blood pressure, reduce any swelling, and protect your heart, liver, and kidneys. We get sodium by adding salt (sodium chloride) to food. Most of our sodium comes from canned, boxed, and frozen foods. Restaurant foods, fast foods, and pizza are also very high in sodium. Even if you take medicine to lower your blood pressure or to reduce fluid in your body, getting less sodium from your food is important. What is my plan? Most people should limit their sodium intake to 2,300 mg a day. Your health care provider recommends that you limit your sodium intake to __________ a day. What do I need to know about this eating plan? For the low-sodium eating plan, you will follow these general guidelines:  Choose foods with a % Daily Value for sodium of less than 5% (as listed on the food label).  Use salt-free seasonings or herbs instead of table salt or sea salt.  Check with your health care provider or pharmacist before using salt substitutes.  Eat fresh foods.  Eat more vegetables and fruits.  Limit canned vegetables. If you do use them, rinse them well to decrease the sodium.  Limit cheese to 1 oz (28 g) per day.  Eat lower-sodium products, often  labeled as "lower sodium" or "no salt added."  Avoid foods that contain monosodium glutamate (MSG). MSG is sometimes added to [place] food and some canned foods.  Check food labels (Nutrition Facts labels) on foods to learn how much sodium is in one serving.  Eat more home-cooked food and less restaurant, buffet, and fast food.  When eating at a restaurant, ask that your food be prepared with less salt, or no salt if possible. How do I read food labels for sodium information? The Nutrition Facts label lists the amount of sodium in one serving of the food. If you eat more than one serving, you must multiply the listed amount of sodium by the number of servings. Food labels may also identify foods as:  Sodium free-Less than 5 mg in a serving.  Very low sodium-35 mg or less in a serving.  Low sodium-140 mg or less in a serving.  Light in sodium-50% less sodium in a serving. For example, if a food that usually has 300 mg of sodium is changed to become light in sodium, it will have 150 mg of sodium.  Reduced sodium-25% less sodium in a serving. For example, if a food that usually has 400 mg of sodium is changed to reduced sodium, it will have 300 mg of sodium. What foods can I eat? Grains  Low-sodium cereals, including oats, puffed wheat and rice, and shredded wheat cereals. Low-sodium crackers. Unsalted rice and pasta. Lower-sodium bread. Vegetables  Frozen or fresh vegetables. Low-sodium or reduced-sodium canned vegetables. Low-sodium or  reduced-sodium tomato sauce and paste. Low-sodium or reduced-sodium tomato and vegetable juices. Fruits  Fresh, frozen, and canned fruit. Fruit juice. Meat and Other Protein Products  Low-sodium canned tuna and salmon. Fresh or frozen meat, poultry, seafood, and fish. Lamb. Unsalted nuts. Dried beans, peas, and lentils without added salt. Unsalted canned beans. Homemade soups without salt. Eggs. Dairy  Milk. Soy milk. Ricotta cheese. Low-sodium or  reduced-sodium cheeses. Yogurt. Condiments  Fresh and dried herbs and spices. Salt-free seasonings. Onion and garlic powders. Low-sodium varieties of mustard and ketchup. Fresh or refrigerated horseradish. Lemon juice. Fats and Oils  Reduced-sodium salad dressings. Unsalted butter. Other  Unsalted popcorn and pretzels. The items listed above may not be a complete list of recommended foods or beverages. Contact your dietitian for more options.  What foods are not recommended? Grains  Instant hot cereals. Bread stuffing, pancake, and biscuit mixes. Croutons. Seasoned rice or pasta mixes. Noodle soup cups. Boxed or frozen macaroni and cheese. Self-rising flour. Regular salted crackers. Vegetables  Regular canned vegetables. Regular canned tomato sauce and paste. Regular tomato and vegetable juices. Frozen vegetables in sauces. Salted [place] fries. Olives. Angie Fava. Relishes. Sauerkraut. Salsa. Meat and Other Protein Products  Salted, canned, smoked, spiced, or pickled meats, seafood, or fish. Bacon, ham, sausage, hot dogs, corned beef, chipped beef, and packaged luncheon meats. Salt pork. Jerky. Pickled herring. Anchovies, regular canned tuna, and sardines. Salted nuts. Dairy  Processed cheese and cheese spreads. Cheese curds. Blue cheese and cottage cheese. Buttermilk. Condiments  Onion and garlic salt, seasoned salt, table salt, and sea salt. Canned and packaged gravies. Worcestershire sauce. Tartar sauce. Barbecue sauce. Teriyaki sauce. Soy sauce, including reduced sodium. Steak sauce. Fish sauce. Oyster sauce. Cocktail sauce. Horseradish that you find on the shelf. Regular ketchup and mustard. Meat flavorings and tenderizers. Bouillon cubes. Hot sauce. Tabasco sauce. Marinades. Taco seasonings. Relishes. Fats and Oils  Regular salad dressings. Salted butter. Margarine. Ghee. Bacon fat. Other  Potato and tortilla chips. Corn chips and puffs. Salted popcorn and pretzels. Canned or dried soups.  Pizza. Frozen entrees and pot pies. The items listed above may not be a complete list of foods and beverages to avoid. Contact your dietitian for more information.  This information is not intended to replace advice given to you by your health care provider. Make sure you discuss any questions you have with your health care provider. Document Released: 07/22/2001 Document Revised: 07/08/2015 Document Reviewed: 12/04/2012 Elsevier Interactive Patient Education  2017 [employer].   If you need a refill on your cardiac medications before your next appointment, please call your pharmacy.

## 2016-03-22 NOTE — Progress Notes (Signed)
Cardiology Office Note    Date:  03/22/2016   ID:  Kathy Montgomery, DOB 02/21/1945, MRN [id number]  PCP:  Kandice Hams, MD  Cardiologist: Sinclair Grooms, MD   Chief Complaint  Patient presents with  . Atrial Fibrillation    History of Present Illness:  Kathy Montgomery is a 71 y.o. female who is here for follow-up of paroxysmal atrial fibrillation and chronic anticoagulation therapy.  No major cardiac complaints. She takes her medications at night. She denies chest pain. Occasional palpitations are noted. Occasional wheezing is noted.   Past Medical History:  Diagnosis Date  . Atrial fibrillation ([place]) 11/14/2012  . Hypercholesteremia   . Hypertension   . Long term (current) use of anticoagulants 12/17/2012   Atrial fibrillation   . MI (myocardial infarction)     Past Surgical History:  Procedure Laterality Date  . ECTOPIC PREGNANCY SURGERY      Current Medications: Outpatient Medications Prior to Visit  Medication Sig Dispense Refill  . atorvastatin (LIPITOR) 40 MG tablet Take 40 mg by mouth at bedtime.    . furosemide (LASIX) 40 MG tablet Take 1 tablet (40 mg total) by mouth daily. 30 tablet 11  . PROAIR HFA 108 (90 Base) MCG/ACT inhaler INHALE 2 PUFFS BY MOUTH EVERY 4 HOURS AS NEEDED 8.5 g 1  . warfarin (COUMADIN) 5 MG tablet TAKE 1 TABLET BY MOUTH AS DIRECTED 90 tablet 1  . losartan (COZAAR) 100 MG tablet Take 1 tablet (100 mg total) by mouth daily. 90 tablet 3   Facility-Administered Medications Prior to Visit  Medication Dose Route Frequency Provider Last Rate Last Dose  . albuterol (PROVENTIL HFA;VENTOLIN HFA) 108 (90 Base) MCG/ACT inhaler 2 puff  2 puff Inhalation Q6H PRN Isaiah Serge, NP         Allergies:   Patient has no known allergies.   Social History   Social History  . Marital status: Single    Spouse name: N/A  . Number of children: N/A  . Years of education: N/A   Social History Main Topics  . Smoking status: Former Smoker    Types:  Cigarettes    Quit date: 05/17/2000  . Smokeless tobacco: Never Used  . Alcohol use No  . Drug use: No  . Sexual activity: No   Other Topics Concern  . None   Social History Narrative  . None     Family History:  The patient's family history includes Esophageal cancer in her sister; Stroke in her mother.   ROS:   Please see the history of present illness.    No blood in the urine or stool. Occasional palpitations are noted. Occasional wheezing.  All other systems reviewed and are negative.   PHYSICAL EXAM:   VS:  BP (!) 140/96 (BP Location: Right Arm)   Pulse 77   Ht 5\' 3"  (1.6 m)   Wt 215 lb 12.8 oz (97.9 kg)   BMI 38.23 kg/m    GEN: Well nourished, well developed, in no acute distress  HEENT: normal  Neck: no JVD, carotid bruits, or masses Cardiac: RRR; no murmurs, rubs, or gallops,no edema  Respiratory:  clear to auscultation bilaterally, normal work of breathing GI: soft, nontender, nondistended, + BS MS: no deformity or atrophy  Skin: warm and dry, no rash Neuro:  Alert and Oriented x 3, Strength and sensation are intact Psych: euthymic mood, full affect  Wt Readings from Last 3 Encounters:  03/22/16 215 lb 12.8 oz (  97.9 kg)  01/11/16 218 lb 4 oz (99 kg)  12/13/15 222 lb 12.8 oz (101.1 kg)      Studies/Labs Reviewed:   EKG:  EKG  Repeated  Recent Labs: 10/08/2015: Brain Natriuretic Peptide 28.9 12/22/2015: BUN 10; Creat 1.00; Potassium 4.1; Sodium 140   Lipid Panel    Component Value Date/Time   CHOL 158 05/09/2007 2043   TRIG 97 05/09/2007 2043   HDL 45 05/09/2007 2043   CHOLHDL 3.5 Ratio 05/09/2007 2043   VLDL 19 05/09/2007 2043   LDLCALC 94 05/09/2007 2043    Additional studies/ records that were reviewed today include:  No new data    ASSESSMENT:    1. Chronic atrial fibrillation ([place])   2. Chronic diastolic heart failure ([place])   3. HYPERTENSION, BENIGN SYSTEMIC   4. Arteriosclerosis of coronary artery   5. Chronic anticoagulation       PLAN:  In order of problems listed above:  1. Heart rate control is borderline. See below, blood pressures also an acceptable. To manage this, we will add diltiazem CD 120 mg per day. Follow-up in hypertension clinic in 4 weeks. 2. No evidence of excessive volume overload on furosemide 40 mg daily. If blood pressure remains a concern (greater than 140/90 mmHg), consider adding low-dose spironolactone or E pleura known. 3. 2 g sodium diet. Add diltiazem CD 120 mg per day 4. Continue Coumadin anticoagulation.  She is doing relatively well. Our overall goal is to control blood pressure and atrial fibrillation rate. No complications on chronic anticoagulation therapy.    Medication Adjustments/Labs and Tests Ordered: Current medicines are reviewed at length with the patient today.  Concerns regarding medicines are outlined above.  Medication changes, Labs and Tests ordered today are listed in the Patient Instructions below. Patient Instructions  Medication Instructions:  Your physician has recommended you make the following change in your medication: START - Diltiazem (Cardizem CD) 120 mg by mouth daily.   Labwork: None ordered  Testing/Procedures: None ordered  Follow-Up: Your physician has requested that you follow-up in BP Clinic in 1 month.  Your physician wants you to follow-up in 9 -12 months with Dr. Tamala Julian.You will receive a reminder letter in the mail two months in advance. If you don't receive a letter, please call our office to schedule the follow-up appointment.  Any Other Special Instructions Will Be Listed Below (If Applicable).  Low-Sodium Eating Plan Sodium raises blood pressure and causes water to be held in the body. Getting less sodium from food will help lower your blood pressure, reduce any swelling, and protect your heart, liver, and kidneys. We get sodium by adding salt (sodium chloride) to food. Most of our sodium comes from canned, boxed, and frozen  foods. Restaurant foods, fast foods, and pizza are also very high in sodium. Even if you take medicine to lower your blood pressure or to reduce fluid in your body, getting less sodium from your food is important. What is my plan? Most people should limit their sodium intake to 2,300 mg a day. Your health care provider recommends that you limit your sodium intake to __________ a day. What do I need to know about this eating plan? For the low-sodium eating plan, you will follow these general guidelines:  Choose foods with a % Daily Value for sodium of less than 5% (as listed on the food label).  Use salt-free seasonings or herbs instead of table salt or sea salt.  Check with your health care provider or  pharmacist before using salt substitutes.  Eat fresh foods.  Eat more vegetables and fruits.  Limit canned vegetables. If you do use them, rinse them well to decrease the sodium.  Limit cheese to 1 oz (28 g) per day.  Eat lower-sodium products, often labeled as "lower sodium" or "no salt added."  Avoid foods that contain monosodium glutamate (MSG). MSG is sometimes added to [place] food and some canned foods.  Check food labels (Nutrition Facts labels) on foods to learn how much sodium is in one serving.  Eat more home-cooked food and less restaurant, buffet, and fast food.  When eating at a restaurant, ask that your food be prepared with less salt, or no salt if possible. How do I read food labels for sodium information? The Nutrition Facts label lists the amount of sodium in one serving of the food. If you eat more than one serving, you must multiply the listed amount of sodium by the number of servings. Food labels may also identify foods as:  Sodium free-Less than 5 mg in a serving.  Very low sodium-35 mg or less in a serving.  Low sodium-140 mg or less in a serving.  Light in sodium-50% less sodium in a serving. For example, if a food that usually has 300 mg of sodium is  changed to become light in sodium, it will have 150 mg of sodium.  Reduced sodium-25% less sodium in a serving. For example, if a food that usually has 400 mg of sodium is changed to reduced sodium, it will have 300 mg of sodium. What foods can I eat? Grains  Low-sodium cereals, including oats, puffed wheat and rice, and shredded wheat cereals. Low-sodium crackers. Unsalted rice and pasta. Lower-sodium bread. Vegetables  Frozen or fresh vegetables. Low-sodium or reduced-sodium canned vegetables. Low-sodium or reduced-sodium tomato sauce and paste. Low-sodium or reduced-sodium tomato and vegetable juices. Fruits  Fresh, frozen, and canned fruit. Fruit juice. Meat and Other Protein Products  Low-sodium canned tuna and salmon. Fresh or frozen meat, poultry, seafood, and fish. Lamb. Unsalted nuts. Dried beans, peas, and lentils without added salt. Unsalted canned beans. Homemade soups without salt. Eggs. Dairy  Milk. Soy milk. Ricotta cheese. Low-sodium or reduced-sodium cheeses. Yogurt. Condiments  Fresh and dried herbs and spices. Salt-free seasonings. Onion and garlic powders. Low-sodium varieties of mustard and ketchup. Fresh or refrigerated horseradish. Lemon juice. Fats and Oils  Reduced-sodium salad dressings. Unsalted butter. Other  Unsalted popcorn and pretzels. The items listed above may not be a complete list of recommended foods or beverages. Contact your dietitian for more options.  What foods are not recommended? Grains  Instant hot cereals. Bread stuffing, pancake, and biscuit mixes. Croutons. Seasoned rice or pasta mixes. Noodle soup cups. Boxed or frozen macaroni and cheese. Self-rising flour. Regular salted crackers. Vegetables  Regular canned vegetables. Regular canned tomato sauce and paste. Regular tomato and vegetable juices. Frozen vegetables in sauces. Salted [place] fries. Olives. Angie Fava. Relishes. Sauerkraut. Salsa. Meat and Other Protein Products  Salted, canned,  smoked, spiced, or pickled meats, seafood, or fish. Bacon, ham, sausage, hot dogs, corned beef, chipped beef, and packaged luncheon meats. Salt pork. Jerky. Pickled herring. Anchovies, regular canned tuna, and sardines. Salted nuts. Dairy  Processed cheese and cheese spreads. Cheese curds. Blue cheese and cottage cheese. Buttermilk. Condiments  Onion and garlic salt, seasoned salt, table salt, and sea salt. Canned and packaged gravies. Worcestershire sauce. Tartar sauce. Barbecue sauce. Teriyaki sauce. Soy sauce, including reduced sodium. Steak sauce. Fish sauce.  Oyster sauce. Cocktail sauce. Horseradish that you find on the shelf. Regular ketchup and mustard. Meat flavorings and tenderizers. Bouillon cubes. Hot sauce. Tabasco sauce. Marinades. Taco seasonings. Relishes. Fats and Oils  Regular salad dressings. Salted butter. Margarine. Ghee. Bacon fat. Other  Potato and tortilla chips. Corn chips and puffs. Salted popcorn and pretzels. Canned or dried soups. Pizza. Frozen entrees and pot pies. The items listed above may not be a complete list of foods and beverages to avoid. Contact your dietitian for more information.  This information is not intended to replace advice given to you by your health care provider. Make sure you discuss any questions you have with your health care provider. Document Released: 07/22/2001 Document Revised: 07/08/2015 Document Reviewed: 12/04/2012 Elsevier Interactive Patient Education  2017 [employer].   If you need a refill on your cardiac medications before your next appointment, please call your pharmacy.      Signed, Sinclair Grooms, MD  03/22/2016 9:04 AM    [facility] [place], [place],   [address] Phone: [phone]; Fax: [phone]

## 2016-04-17 ENCOUNTER — Telehealth: Payer: Self-pay | Admitting: *Deleted

## 2016-04-17 NOTE — Telephone Encounter (Signed)
Pt called stating wanted to make her appt with time of appt with pharmacist on March 16th Informed that she missed her appt in February and that she needs to be seen this week. Appt made for Thursday March 8th . Pt then states that she was ordered to start Cardizem CD 120mg  daily when seen by Dr Tamala Julian  On Feb 7th and she states she did not know if it interferred with coumadin so she has not taken Pt instructed that she needs to take Cardizem as ordered per Dr Tamala Julian and to keep her scheduled appts with coumadin clinic and with Pharmacist and she states understanding

## 2016-04-19 ENCOUNTER — Ambulatory Visit: Payer: Medicare Other

## 2016-04-20 ENCOUNTER — Encounter (INDEPENDENT_AMBULATORY_CARE_PROVIDER_SITE_OTHER): Payer: Self-pay

## 2016-04-20 ENCOUNTER — Ambulatory Visit (INDEPENDENT_AMBULATORY_CARE_PROVIDER_SITE_OTHER): Payer: Medicare Other | Admitting: Pharmacist

## 2016-04-20 DIAGNOSIS — I480 Paroxysmal atrial fibrillation: Secondary | ICD-10-CM

## 2016-04-20 DIAGNOSIS — Z5181 Encounter for therapeutic drug level monitoring: Secondary | ICD-10-CM | POA: Diagnosis not present

## 2016-04-20 DIAGNOSIS — I482 Chronic atrial fibrillation, unspecified: Secondary | ICD-10-CM

## 2016-04-20 LAB — POCT INR: INR: 1.6

## 2016-04-28 ENCOUNTER — Encounter: Payer: Self-pay | Admitting: Pharmacist

## 2016-04-28 ENCOUNTER — Ambulatory Visit (INDEPENDENT_AMBULATORY_CARE_PROVIDER_SITE_OTHER): Payer: Medicare Other | Admitting: Pharmacist

## 2016-04-28 VITALS — BP 130/78 | HR 65

## 2016-04-28 DIAGNOSIS — I10 Essential (primary) hypertension: Secondary | ICD-10-CM

## 2016-04-28 NOTE — Patient Instructions (Addendum)
Continue taking your medications - your blood pressure was at goal today at 130/78.  Start using your exercise tapes 4 days a week.  Look for a bicep blood pressure cuff and keep an eye on your blood pressure readings at home.  Follow up as needed.

## 2016-04-28 NOTE — Progress Notes (Signed)
Patient ID: Kathy Montgomery                 DOB: November 04, 1945                      MRN: [id number]     HPI: Kathy Montgomery is a 71 y.o. female referred by Dr. Tamala Julian to HTN clinic. PMH is significant for PAF, HLD, HTN, and MI. BP was elevated at 140/96 at pt's last visit. She was started on diltiazem CD 120mg  daily for BP and afib rate control 1 month ago and presents today for follow up.  Pt reports feeling well overall. She cannot feel her afib. She denies dizziness, headache, blurred vision, or falls. No constipation with diltiazem. Does not check her BP at home but is interested in purchasing a BP cuff. Currently using Mucinex for a productive cough. She has not taken her BP medications this morning yet.  Current HTN meds: losartan 100mg  daily, diltiazem CD 120mg  daily, furosemide 40mg  daily BP goal: <130/24mmHg  Family History: The patient's family history includes Esophageal cancer in her sister; Stroke in her mother.   Social History: Former smoker, quit in 2002. Denies alcohol and illicit drug use.  Diet: Large coke each day. Does not cook with salt at home. Cooks most of her food at home.   Exercise: Uses work out tapes at home.  Wt Readings from Last 3 Encounters:  03/22/16 215 lb 12.8 oz (97.9 kg)  01/11/16 218 lb 4 oz (99 kg)  12/13/15 222 lb 12.8 oz (101.1 kg)   BP Readings from Last 3 Encounters:  03/22/16 (!) 140/96  01/11/16 132/82  12/13/15 (!) 150/90   Pulse Readings from Last 3 Encounters:  03/22/16 77  01/11/16 64  12/13/15 68    Renal function: CrCl cannot be calculated (Patient's most recent lab result is older than the maximum 21 days allowed.).  Past Medical History:  Diagnosis Date  . Atrial fibrillation ([place]) 11/14/2012  . Hypercholesteremia   . Hypertension   . Long term (current) use of anticoagulants 12/17/2012   Atrial fibrillation   . MI (myocardial infarction)     Current Outpatient Prescriptions on File Prior to Visit  Medication Sig  Dispense Refill  . atorvastatin (LIPITOR) 40 MG tablet Take 40 mg by mouth at bedtime.    Marland Kitchen diltiazem (CARDIZEM CD) 120 MG 24 hr capsule Take 1 capsule (120 mg total) by mouth daily. 90 capsule 3  . furosemide (LASIX) 40 MG tablet Take 1 tablet (40 mg total) by mouth daily. 30 tablet 11  . losartan (COZAAR) 100 MG tablet Take 1 tablet (100 mg total) by mouth daily. 90 tablet 3  . PROAIR HFA 108 (90 Base) MCG/ACT inhaler INHALE 2 PUFFS BY MOUTH EVERY 4 HOURS AS NEEDED 8.5 g 1  . warfarin (COUMADIN) 5 MG tablet TAKE 1 TABLET BY MOUTH AS DIRECTED 90 tablet 1   Current Facility-Administered Medications on File Prior to Visit  Medication Dose Route Frequency Provider Last Rate Last Dose  . albuterol (PROVENTIL HFA;VENTOLIN HFA) 108 (90 Base) MCG/ACT inhaler 2 puff  2 puff Inhalation Q6H PRN Isaiah Serge, NP        No Known Allergies   Assessment/Plan:  1. Hypertension - BP at goal <130/68mmHg since starting diltiazem for afib rate control and BP control. Pt reports feeling well. Will not make any medication changes today. Encouraged pt to use her work out tapes more frequently, she is motivated  to lose weight. She is planning to purchase a BP cuff soon. Advised her to start checking her BP at home and call clinic with concerns.   Chevon Laufer E. Supple, PharmD, CPP, [facility] [address] [place], [place] [address] Phone: [phone]; Fax: [phone] 04/28/2016 10:33 AM

## 2016-05-04 ENCOUNTER — Ambulatory Visit (INDEPENDENT_AMBULATORY_CARE_PROVIDER_SITE_OTHER): Payer: Medicare Other | Admitting: *Deleted

## 2016-05-04 DIAGNOSIS — I482 Chronic atrial fibrillation, unspecified: Secondary | ICD-10-CM

## 2016-05-04 DIAGNOSIS — Z5181 Encounter for therapeutic drug level monitoring: Secondary | ICD-10-CM

## 2016-05-04 DIAGNOSIS — I480 Paroxysmal atrial fibrillation: Secondary | ICD-10-CM

## 2016-05-04 LAB — POCT INR: INR: 2.5

## 2016-05-25 ENCOUNTER — Ambulatory Visit (INDEPENDENT_AMBULATORY_CARE_PROVIDER_SITE_OTHER): Payer: Medicare Other | Admitting: *Deleted

## 2016-05-25 DIAGNOSIS — Z5181 Encounter for therapeutic drug level monitoring: Secondary | ICD-10-CM

## 2016-05-25 DIAGNOSIS — I480 Paroxysmal atrial fibrillation: Secondary | ICD-10-CM

## 2016-05-25 DIAGNOSIS — I482 Chronic atrial fibrillation, unspecified: Secondary | ICD-10-CM

## 2016-05-25 LAB — POCT INR: INR: 1.6

## 2016-06-14 ENCOUNTER — Ambulatory Visit (INDEPENDENT_AMBULATORY_CARE_PROVIDER_SITE_OTHER): Payer: Medicare Other | Admitting: Pharmacist

## 2016-06-14 DIAGNOSIS — I480 Paroxysmal atrial fibrillation: Secondary | ICD-10-CM | POA: Diagnosis not present

## 2016-06-14 DIAGNOSIS — I482 Chronic atrial fibrillation, unspecified: Secondary | ICD-10-CM

## 2016-06-14 DIAGNOSIS — Z5181 Encounter for therapeutic drug level monitoring: Secondary | ICD-10-CM

## 2016-06-14 LAB — POCT INR: INR: 2.7

## 2016-07-12 ENCOUNTER — Ambulatory Visit (INDEPENDENT_AMBULATORY_CARE_PROVIDER_SITE_OTHER): Payer: Medicare Other | Admitting: *Deleted

## 2016-07-12 DIAGNOSIS — I482 Chronic atrial fibrillation, unspecified: Secondary | ICD-10-CM

## 2016-07-12 DIAGNOSIS — I480 Paroxysmal atrial fibrillation: Secondary | ICD-10-CM | POA: Diagnosis not present

## 2016-07-12 DIAGNOSIS — Z5181 Encounter for therapeutic drug level monitoring: Secondary | ICD-10-CM | POA: Diagnosis not present

## 2016-07-12 LAB — POCT INR: INR: 1.4

## 2016-07-20 ENCOUNTER — Ambulatory Visit (INDEPENDENT_AMBULATORY_CARE_PROVIDER_SITE_OTHER): Payer: Medicare Other

## 2016-07-20 DIAGNOSIS — Z5181 Encounter for therapeutic drug level monitoring: Secondary | ICD-10-CM | POA: Diagnosis not present

## 2016-07-20 DIAGNOSIS — I482 Chronic atrial fibrillation, unspecified: Secondary | ICD-10-CM

## 2016-07-20 DIAGNOSIS — I480 Paroxysmal atrial fibrillation: Secondary | ICD-10-CM | POA: Diagnosis not present

## 2016-07-20 LAB — POCT INR: INR: 2.4

## 2016-07-28 ENCOUNTER — Other Ambulatory Visit: Payer: Self-pay | Admitting: Interventional Cardiology

## 2016-08-03 ENCOUNTER — Encounter (INDEPENDENT_AMBULATORY_CARE_PROVIDER_SITE_OTHER): Payer: Self-pay

## 2016-08-03 ENCOUNTER — Ambulatory Visit (INDEPENDENT_AMBULATORY_CARE_PROVIDER_SITE_OTHER): Payer: Medicare Other

## 2016-08-03 DIAGNOSIS — I480 Paroxysmal atrial fibrillation: Secondary | ICD-10-CM

## 2016-08-03 DIAGNOSIS — I482 Chronic atrial fibrillation, unspecified: Secondary | ICD-10-CM

## 2016-08-03 DIAGNOSIS — Z5181 Encounter for therapeutic drug level monitoring: Secondary | ICD-10-CM

## 2016-08-03 LAB — POCT INR: INR: 2.5

## 2016-09-11 ENCOUNTER — Ambulatory Visit (INDEPENDENT_AMBULATORY_CARE_PROVIDER_SITE_OTHER): Payer: Medicare Other | Admitting: *Deleted

## 2016-09-11 DIAGNOSIS — I480 Paroxysmal atrial fibrillation: Secondary | ICD-10-CM

## 2016-09-11 DIAGNOSIS — I482 Chronic atrial fibrillation, unspecified: Secondary | ICD-10-CM

## 2016-09-11 DIAGNOSIS — Z5181 Encounter for therapeutic drug level monitoring: Secondary | ICD-10-CM

## 2016-09-11 LAB — POCT INR: INR: 1.9

## 2016-10-11 ENCOUNTER — Ambulatory Visit (INDEPENDENT_AMBULATORY_CARE_PROVIDER_SITE_OTHER): Payer: Medicare Other | Admitting: Pharmacist

## 2016-10-11 DIAGNOSIS — Z5181 Encounter for therapeutic drug level monitoring: Secondary | ICD-10-CM

## 2016-10-11 DIAGNOSIS — I482 Chronic atrial fibrillation, unspecified: Secondary | ICD-10-CM

## 2016-10-11 LAB — POCT INR: INR: 2

## 2016-10-24 ENCOUNTER — Other Ambulatory Visit: Payer: Self-pay | Admitting: Obstetrics and Gynecology

## 2016-10-24 DIAGNOSIS — Z0000 Encounter for general adult medical examination without abnormal findings: Secondary | ICD-10-CM

## 2016-10-25 ENCOUNTER — Ambulatory Visit
Admission: RE | Admit: 2016-10-25 | Discharge: 2016-10-25 | Disposition: A | Discharge status: Home / Self Care | Payer: Medicare Other | Source: Ambulatory Visit | Attending: Obstetrics and Gynecology | Admitting: Obstetrics and Gynecology

## 2016-10-25 DIAGNOSIS — Z0000 Encounter for general adult medical examination without abnormal findings: Secondary | ICD-10-CM

## 2016-11-06 ENCOUNTER — Other Ambulatory Visit: Payer: Self-pay | Admitting: Interventional Cardiology

## 2016-11-08 ENCOUNTER — Ambulatory Visit (INDEPENDENT_AMBULATORY_CARE_PROVIDER_SITE_OTHER): Payer: Medicare Other | Admitting: *Deleted

## 2016-11-08 DIAGNOSIS — Z5181 Encounter for therapeutic drug level monitoring: Secondary | ICD-10-CM | POA: Diagnosis not present

## 2016-11-08 DIAGNOSIS — I482 Chronic atrial fibrillation, unspecified: Secondary | ICD-10-CM

## 2016-11-08 DIAGNOSIS — I480 Paroxysmal atrial fibrillation: Secondary | ICD-10-CM | POA: Diagnosis not present

## 2016-11-08 LAB — POCT INR: INR: 2.8

## 2016-11-17 ENCOUNTER — Telehealth: Payer: Self-pay | Admitting: *Deleted

## 2016-11-17 NOTE — Telephone Encounter (Signed)
   [facility] Pre-operative Risk Assessment    Request for surgical clearance:  1. What type of surgery is being performed? Excision of Lipoma   2. When is this surgery scheduled? N/A  3. Are there any medications that need to be held prior to surgery and how long? Warfarin for 5 days preoperatively  4. Practice name and name of physician performing surgery? Dr.Paul Toth III  5. What is your office phone and fax number? Phone [phone] Fax [phone]  6. Anesthesia type (None, local, MAC, general) ? General Anesthesia   [place], Kathy R 11/17/2016, 9:20 AM  _________________________________________________________________   (provider comments below)

## 2016-11-17 NOTE — Telephone Encounter (Signed)
Pt takes warfarin for afib with CHADS2VASc of 5 (HTN, CHF, CAD, age, sex). Ok to hold warfarin for 5 days prior to procedure with no Lovenox bridge. Clearance faxed to below number.

## 2016-11-17 NOTE — Telephone Encounter (Signed)
   Chart reviewed as part of pre-operative protocol coverage. Given past medical history and time since last visit, based on ACC/AHA guidelines, LEONA ALEN would be at acceptable risk for the planned procedure without further cardiovascular testing.   Spoke with the patient by phone. She has no symptoms and is doing well. Perioperative risk of a major cardiac event is 0.4%. I will route this to the pharmacist for Coumadin management.  Lenoard Aden 11/17/2016, 4:36 PM

## 2016-12-08 ENCOUNTER — Encounter: Payer: Self-pay | Admitting: Interventional Cardiology

## 2016-12-08 ENCOUNTER — Encounter (INDEPENDENT_AMBULATORY_CARE_PROVIDER_SITE_OTHER): Payer: Self-pay

## 2016-12-08 ENCOUNTER — Ambulatory Visit (INDEPENDENT_AMBULATORY_CARE_PROVIDER_SITE_OTHER): Payer: Medicare Other

## 2016-12-08 ENCOUNTER — Ambulatory Visit (INDEPENDENT_AMBULATORY_CARE_PROVIDER_SITE_OTHER): Payer: Medicare Other | Admitting: Interventional Cardiology

## 2016-12-08 VITALS — BP 158/92 | HR 58 | Ht 63.0 in | Wt 225.0 lb

## 2016-12-08 DIAGNOSIS — I5032 Chronic diastolic (congestive) heart failure: Secondary | ICD-10-CM | POA: Diagnosis not present

## 2016-12-08 DIAGNOSIS — I2510 Atherosclerotic heart disease of native coronary artery without angina pectoris: Secondary | ICD-10-CM

## 2016-12-08 DIAGNOSIS — I482 Chronic atrial fibrillation, unspecified: Secondary | ICD-10-CM

## 2016-12-08 DIAGNOSIS — I10 Essential (primary) hypertension: Secondary | ICD-10-CM

## 2016-12-08 DIAGNOSIS — I480 Paroxysmal atrial fibrillation: Secondary | ICD-10-CM

## 2016-12-08 DIAGNOSIS — Z5181 Encounter for therapeutic drug level monitoring: Secondary | ICD-10-CM

## 2016-12-08 LAB — POCT INR: INR: 1.8

## 2016-12-08 MED ORDER — SPIRONOLACTONE 25 MG PO TABS: 25.0000 mg | ORAL_TABLET | Freq: Every day | ORAL | 3 refills | Status: DC

## 2016-12-08 NOTE — Patient Instructions (Signed)
Medication Instructions:  1) START Spironolactone 25mg  once daily.  Labwork: Your physician recommends that you return for lab work in: 2 weeks (BMET)   Testing/Procedures: None  Follow-Up: Your physician recommends that you schedule a follow-up appointment in: 1 month with a PA or NP.    Any Other Special Instructions Will Be Listed Below (If Applicable).     If you need a refill on your cardiac medications before your next appointment, please call your pharmacy.

## 2016-12-08 NOTE — Progress Notes (Signed)
Cardiology Office Note    Date:  12/08/2016   ID:  Kathy Montgomery, DOB Oct 18, 1945, MRN [id number]  PCP:  Kathy Carol, MD  Cardiologist: Kathy Grooms, MD   Chief Complaint  Patient presents with  . Atrial Fibrillation  . Congestive Heart Failure    History of Present Illness:  Kathy Montgomery is a 71 y.o. female here for follow-up of paroxysmal atrial fibrillation and chronic anticoagulation therapy.  Kathy Montgomery notices lower extremity swelling. She has occasional wheezing. She denies orthopnea. No episodes of chest discomfort she has not felt atrial fibrillation. No bleeding complications on her current medical regimen. Her lower extremities is somewhat tender.   Past Medical History:  Diagnosis Date  . Atrial fibrillation ([place]) 11/14/2012  . Hypercholesteremia   . Hypertension   . Long term (current) use of anticoagulants 12/17/2012   Atrial fibrillation   . MI (myocardial infarction) [facility])     Past Surgical History:  Procedure Laterality Date  . ECTOPIC PREGNANCY SURGERY      Current Medications: Outpatient Medications Prior to Visit  Medication Sig Dispense Refill  . atorvastatin (LIPITOR) 40 MG tablet Take 40 mg by mouth at bedtime.    . furosemide (LASIX) 40 MG tablet TAKE 1 TABLET(40 MG) BY MOUTH DAILY 30 tablet 3  . diltiazem (CARDIZEM CD) 120 MG 24 hr capsule Take 1 capsule (120 mg total) by mouth daily. 90 capsule 3  . losartan (COZAAR) 100 MG tablet Take 1 tablet (100 mg total) by mouth daily. 90 tablet 3  . PROAIR HFA 108 (90 Base) MCG/ACT inhaler INHALE 2 PUFFS BY MOUTH EVERY 4 HOURS AS NEEDED (Patient not taking: Reported on 12/08/2016) 8.5 g 1  . warfarin (COUMADIN) 5 MG tablet TAKE 1 TABLET BY MOUTH AS DIRECTED (Patient not taking: Reported on 12/08/2016) 90 tablet 0   Facility-Administered Medications Prior to Visit  Medication Dose Route Frequency Provider Last Rate Last Dose  . albuterol (PROVENTIL HFA;VENTOLIN HFA) 108 (90 Base) MCG/ACT inhaler 2  puff  2 puff Inhalation Q6H PRN Isaiah Serge, NP         Allergies:   Patient has no known allergies.   Social History   Social History  . Marital status: Single    Spouse name: N/A  . Number of children: N/A  . Years of education: N/A   Social History Main Topics  . Smoking status: Former Smoker    Types: Cigarettes    Quit date: 05/17/2000  . Smokeless tobacco: Never Used  . Alcohol use No  . Drug use: No  . Sexual activity: No   Other Topics Concern  . None   Social History Narrative  . None     Family History:  The patient's family history includes Esophageal cancer in her sister; Stroke in her mother.   ROS:   Please see the history of present illness.    Unexplained weight gain,lower extremity swelling, facial swelling, and easy bruising. Denies orthopnea.  All other systems reviewed and are negative.   PHYSICAL EXAM:   VS:  BP (!) 158/92 (BP Location: Right Arm)   Pulse (!) 58   Ht 5\' 3"  (1.6 m)   Wt 225 lb (102.1 kg)   BMI 39.86 kg/m    GEN: Well nourished, well developed, in no acute distress  HEENT: normal  Neck: no JVD, carotid bruits, or masses Cardiac: RRR; no murmurs, rubs, or gallops,no edema  Respiratory:  clear to auscultation bilaterally, normal work of  breathing GI: soft, nontender, nondistended, + BS MS: no deformity or atrophy  Skin: warm and dry, no rash Neuro:  Alert and Oriented x 3, Strength and sensation are intact Psych: euthymic mood, full affect  Wt Readings from Last 3 Encounters:  12/08/16 225 lb (102.1 kg)  03/22/16 215 lb 12.8 oz (97.9 kg)  01/11/16 218 lb 4 oz (99 kg)      Studies/Labs Reviewed:   EKG:  EKG  Sinus bradycardia 58 bpm otherwise normal  Recent Labs: 12/22/2015: BUN 10; Creat 1.00; Potassium 4.1; Sodium 140   Lipid Panel    Component Value Date/Time   CHOL 158 05/09/2007 2043   TRIG 97 05/09/2007 2043   HDL 45 05/09/2007 2043   CHOLHDL 3.5 Ratio 05/09/2007 2043   VLDL 19 05/09/2007 2043    LDLCALC 94 05/09/2007 2043    Additional studies/ records that were reviewed today include:  Hemoglobin A1c is 6.1. Hemoglobin is 14.2, creatinine 1.05, potassium 4.1    ASSESSMENT:    1. Chronic atrial fibrillation ([place])   2. Chronic diastolic heart failure ([place])   3. HYPERTENSION, BENIGN SYSTEMIC   4. Arteriosclerosis of coronary artery      PLAN:  In order of problems listed above:  1. Paroxysmal atrial fibrillation. She is in sinus bradycardia is noted today. Not having symptomatic episodes of tachycardia. 2. This is suspected especially with the 10 pound weight gain since February. She has mild pitting edema in both lower extremities. Add Aldactone 25 mg per day. This will help to further reduce the blood pressure and remove excess volume. Basic metabolic panel will need to be repeated in approximately 10-14 days. Clinical follow-up with me or team  In 2-4 weeks. 3. Blood pressure will improve with Aldactone. 4. No actions at this time.  Clinical follow-up in 3-4 weeks.    Medication Adjustments/Labs and Tests Ordered: Current medicines are reviewed at length with the patient today.  Concerns regarding medicines are outlined above.  Medication changes, Labs and Tests ordered today are listed in the Patient Instructions below. There are no Patient Instructions on file for this visit.   Signed, Kathy Grooms, MD  12/08/2016 11:38 AM    [facility] [address] [place], [place], [place]  [address] Phone: [phone]; Fax: [phone]

## 2016-12-21 ENCOUNTER — Other Ambulatory Visit: Payer: Medicare Other

## 2016-12-21 DIAGNOSIS — I5032 Chronic diastolic (congestive) heart failure: Secondary | ICD-10-CM

## 2016-12-22 LAB — BASIC METABOLIC PANEL
BUN/Creatinine Ratio: 15 (ref 12–28)
BUN: 18 mg/dL (ref 8–27)
CO2: 27 mmol/L (ref 20–29)
Calcium: 11.1 mg/dL — ABNORMAL HIGH (ref 8.7–10.3)
Chloride: 103 mmol/L (ref 96–106)
Creatinine, Ser: 1.18 mg/dL — ABNORMAL HIGH (ref 0.57–1.00)
GFR, EST AFRICAN AMERICAN: 54 mL/min/{1.73_m2} — AB (ref >59)
GFR, EST NON AFRICAN AMERICAN: 47 mL/min/{1.73_m2} — AB (ref >59)
Glucose: 91 mg/dL (ref 65–99)
POTASSIUM: 4.3 mmol/L (ref 3.5–5.2)
SODIUM: 141 mmol/L (ref 134–144)

## 2016-12-26 ENCOUNTER — Telehealth: Payer: Self-pay | Admitting: Interventional Cardiology

## 2016-12-26 NOTE — Telephone Encounter (Signed)
Results reviewed with pt.  Results sent to Dr. Lina Sar office and advised pt to contact them about what to do in regards to Calcium.  Pt verbalized understandnig.

## 2016-12-26 NOTE — Telephone Encounter (Signed)
New message ° ° °Patient calling for lab results. Please call °

## 2016-12-28 ENCOUNTER — Ambulatory Visit (INDEPENDENT_AMBULATORY_CARE_PROVIDER_SITE_OTHER): Payer: Medicare Other

## 2016-12-28 DIAGNOSIS — I480 Paroxysmal atrial fibrillation: Secondary | ICD-10-CM | POA: Diagnosis not present

## 2016-12-28 DIAGNOSIS — I482 Chronic atrial fibrillation, unspecified: Secondary | ICD-10-CM

## 2016-12-28 DIAGNOSIS — Z5181 Encounter for therapeutic drug level monitoring: Secondary | ICD-10-CM

## 2016-12-28 LAB — POCT INR: INR: 2.2

## 2016-12-28 NOTE — Patient Instructions (Signed)
Continue on same dosage 1/2 tablet daily except 1 tablet on Sundays, Wednesdays, and Fridays. Recheck INR in 3 weeks.  Call if placed on any new medications [phone].  Call when you find out if you need to hold your Coumadin for lipoma excision on 01/11/17.

## 2017-01-03 ENCOUNTER — Telehealth: Payer: Self-pay | Admitting: *Deleted

## 2017-01-03 NOTE — Telephone Encounter (Signed)
Spoke with pt and she states she is scheduled for surgery on 01/11/2017 and she has been cleared to hold coumadin for 5 days without Lovenox bridge Pt instructed that last day to take her coumadin in Nov 23rd then no coumadin until has surgery then instructed to ask Surgeon when she is to restart her coumadin and when instructed to restart  Restart at same dose 1/2 tablet daily except 1 tablet on Sundays Wednesdays and Fridays and she already has an appt to be seen 1 week from surgery on Jan 18, 2017 and pt states understanding

## 2017-01-07 ENCOUNTER — Other Ambulatory Visit: Payer: Self-pay | Admitting: Cardiology

## 2017-01-07 DIAGNOSIS — I5032 Chronic diastolic (congestive) heart failure: Secondary | ICD-10-CM

## 2017-01-07 DIAGNOSIS — I10 Essential (primary) hypertension: Secondary | ICD-10-CM

## 2017-01-11 ENCOUNTER — Ambulatory Visit: Payer: Medicare Other | Admitting: Cardiology

## 2017-01-16 ENCOUNTER — Telehealth: Payer: Self-pay | Admitting: Interventional Cardiology

## 2017-01-16 NOTE — Telephone Encounter (Signed)
New Message     Patient would like you to call her about her medication

## 2017-01-16 NOTE — Telephone Encounter (Signed)
Returned call to pt, LMOM TCB.

## 2017-01-17 NOTE — Telephone Encounter (Signed)
Spoke with the pt and she states her surgery was cancelled on 01/11/17 and she has not rescheduled. But wanted to know if she should be resume her Coumadin I told her yes and she did state she did take a tablet yesterday. Thus, she will keep her appt on 01/22/17.

## 2017-01-23 DIAGNOSIS — I480 Paroxysmal atrial fibrillation: Secondary | ICD-10-CM | POA: Insufficient documentation
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
## 2017-01-24 ENCOUNTER — Encounter: Payer: Self-pay | Admitting: Cardiology

## 2017-01-26 ENCOUNTER — Ambulatory Visit (INDEPENDENT_AMBULATORY_CARE_PROVIDER_SITE_OTHER): Payer: Medicare Other | Admitting: Pharmacist

## 2017-01-26 ENCOUNTER — Encounter: Payer: Self-pay | Admitting: Cardiology

## 2017-01-26 ENCOUNTER — Ambulatory Visit (INDEPENDENT_AMBULATORY_CARE_PROVIDER_SITE_OTHER): Payer: Medicare Other | Admitting: Cardiology

## 2017-01-26 VITALS — BP 140/74 | HR 68 | Ht 63.0 in | Wt 222.1 lb

## 2017-01-26 DIAGNOSIS — Z7901 Long term (current) use of anticoagulants: Secondary | ICD-10-CM

## 2017-01-26 DIAGNOSIS — I5032 Chronic diastolic (congestive) heart failure: Secondary | ICD-10-CM | POA: Diagnosis not present

## 2017-01-26 DIAGNOSIS — Z5181 Encounter for therapeutic drug level monitoring: Secondary | ICD-10-CM | POA: Diagnosis not present

## 2017-01-26 DIAGNOSIS — I480 Paroxysmal atrial fibrillation: Secondary | ICD-10-CM | POA: Diagnosis not present

## 2017-01-26 DIAGNOSIS — I482 Chronic atrial fibrillation, unspecified: Secondary | ICD-10-CM

## 2017-01-26 DIAGNOSIS — I10 Essential (primary) hypertension: Secondary | ICD-10-CM

## 2017-01-26 LAB — POCT INR: INR: 1.4

## 2017-01-26 MED ORDER — FLUTICASONE PROPIONATE 50 MCG/ACT NA SUSP: 1.0000 | Freq: Every day | NASAL | 2 refills | Status: DC

## 2017-01-26 NOTE — Patient Instructions (Signed)
Description   Today take 1.5 tablets, tomorrow taking 1 tablet then continue on same dosage 1/2 tablet daily except 1 tablet on Sundays, Wednesdays, and Fridays. Recheck INR in 1 week.  Call if placed on any new medications [phone].

## 2017-01-26 NOTE — Patient Instructions (Addendum)
Medication Instructions:  Your physician recommends that you continue on your current medications as directed. Please refer to the Current Medication list given to you today.   Labwork: TODAY:  PRO BNP  Testing/Procedures: Your physician has requested that you have an echocardiogram. Echocardiography is a painless test that uses sound waves to create images of your heart. It provides your doctor with information about the size and shape of your heart and how well your heart's chambers and valves are working. This procedure takes approximately one hour. There are no restrictions for this procedure.    Follow-Up: Your physician recommends that you schedule a follow-up appointment in: [place]   Any Other Special Instructions Will Be Listed Below (If Applicable). Echocardiogram An echocardiogram, or echocardiography, uses sound waves (ultrasound) to produce an image of your heart. The echocardiogram is simple, painless, obtained within a short period of time, and offers valuable information to your health care provider. The images from an echocardiogram can provide information such as:  Evidence of coronary artery disease (CAD).  Heart size.  Heart muscle function.  Heart valve function.  Aneurysm detection.  Evidence of a past heart attack.  Fluid buildup around the heart.  Heart muscle thickening.  Assess heart valve function.  Tell a health care provider about:  Any allergies you have.  All medicines you are taking, including vitamins, herbs, eye drops, creams, and over-the-counter medicines.  Any problems you or family members have had with anesthetic medicines.  Any blood disorders you have.  Any surgeries you have had.  Any medical conditions you have.  Whether you are pregnant or may be pregnant. What happens before the procedure? No special preparation is needed. Eat and drink normally. What happens during the procedure?  In order to  produce an image of your heart, gel will be applied to your chest and a wand-like tool (transducer) will be moved over your chest. The gel will help transmit the sound waves from the transducer. The sound waves will harmlessly bounce off your heart to allow the heart images to be captured in real-time motion. These images will then be recorded.  You may need an IV to receive a medicine that improves the quality of the pictures. What happens after the procedure? You may return to your normal schedule including diet, activities, and medicines, unless your health care provider tells you otherwise. This information is not intended to replace advice given to you by your health care provider. Make sure you discuss any questions you have with your health care provider. Document Released: 01/28/2000 Document Revised: 09/18/2015 Document Reviewed: 10/07/2012 Elsevier Interactive Patient Education  2017 [employer].     If you need a refill on your cardiac medications before your next appointment, please call your pharmacy.

## 2017-01-31 ENCOUNTER — Other Ambulatory Visit (HOSPITAL_COMMUNITY): Payer: Medicare Other

## 2017-02-01 ENCOUNTER — Telehealth: Payer: Self-pay | Admitting: *Deleted

## 2017-02-01 ENCOUNTER — Other Ambulatory Visit: Payer: Medicare Other | Admitting: *Deleted

## 2017-02-01 DIAGNOSIS — I482 Chronic atrial fibrillation, unspecified: Secondary | ICD-10-CM

## 2017-02-01 LAB — PRO B NATRIURETIC PEPTIDE: NT-PRO BNP: 60 pg/mL (ref 0–301)

## 2017-02-01 MED ORDER — ATORVASTATIN CALCIUM 40 MG PO TABS: 40.0000 mg | ORAL_TABLET | Freq: Every day | ORAL | 3 refills | Status: DC

## 2017-02-01 NOTE — Telephone Encounter (Signed)
-----   Message from Daune Perch, NP sent at 01/31/2017  1:36 PM EST ----- Regarding: labs Hi Anderson [place]. I saw this pt last week and ordered a BNP. I never saw the result. I guess she did not have it done. Can we get her in to have a BNP if she is still feeling short of breath.   ----- Message ----- From: SYSTEM Sent: 01/31/2017  12:07 AM To: Daune Perch, NP

## 2017-02-01 NOTE — Telephone Encounter (Signed)
Pt returned my call and she stated that she got confused and just didn't go to the lab, but she will come by the office this morning and get PRO BNP done.

## 2017-02-01 NOTE — Telephone Encounter (Signed)
Left message for pt to call back re: missed lab work after her appt 01/26/17.

## 2017-02-02 ENCOUNTER — Ambulatory Visit (INDEPENDENT_AMBULATORY_CARE_PROVIDER_SITE_OTHER): Payer: Medicare Other | Admitting: *Deleted

## 2017-02-02 DIAGNOSIS — Z5181 Encounter for therapeutic drug level monitoring: Secondary | ICD-10-CM

## 2017-02-02 DIAGNOSIS — I482 Chronic atrial fibrillation, unspecified: Secondary | ICD-10-CM

## 2017-02-02 LAB — POCT INR: INR: 2

## 2017-02-02 NOTE — Patient Instructions (Signed)
Description   Change your dose to 1 tablet everyday except 1/2 tablet on Tuesdays, Thursdays and Saturdays. Recheck in 2 weeks.   Call if placed on any new medications [phone].

## 2017-02-15 ENCOUNTER — Ambulatory Visit (HOSPITAL_COMMUNITY): Payer: Medicare Other | Attending: Cardiology

## 2017-02-15 ENCOUNTER — Ambulatory Visit (INDEPENDENT_AMBULATORY_CARE_PROVIDER_SITE_OTHER): Payer: Medicare Other | Admitting: *Deleted

## 2017-02-15 ENCOUNTER — Other Ambulatory Visit: Payer: Self-pay

## 2017-02-15 DIAGNOSIS — I482 Chronic atrial fibrillation, unspecified: Secondary | ICD-10-CM

## 2017-02-15 DIAGNOSIS — Z5181 Encounter for therapeutic drug level monitoring: Secondary | ICD-10-CM

## 2017-02-15 DIAGNOSIS — I5032 Chronic diastolic (congestive) heart failure: Secondary | ICD-10-CM | POA: Diagnosis not present

## 2017-02-15 DIAGNOSIS — I071 Rheumatic tricuspid insufficiency: Secondary | ICD-10-CM | POA: Diagnosis not present

## 2017-02-15 LAB — POCT INR: INR: 3.1

## 2017-02-15 NOTE — Patient Instructions (Signed)
Description   Do not take any Coumadin today then continue taking 1 tablet everyday except 1/2 tablet on Tuesdays, Thursdays and Saturdays. Recheck in 2 weeks.  Call if placed on any new medications [phone].

## 2017-02-20 ENCOUNTER — Telehealth: Payer: Self-pay | Admitting: Interventional Cardiology

## 2017-02-20 NOTE — Telephone Encounter (Signed)
New Message     Patient returning call from Triage regarding results for Echo

## 2017-02-20 NOTE — Telephone Encounter (Signed)
Pt is aware of results. 

## 2017-03-01 ENCOUNTER — Ambulatory Visit (INDEPENDENT_AMBULATORY_CARE_PROVIDER_SITE_OTHER): Payer: Medicare Other | Admitting: *Deleted

## 2017-03-01 DIAGNOSIS — I482 Chronic atrial fibrillation, unspecified: Secondary | ICD-10-CM

## 2017-03-01 DIAGNOSIS — Z5181 Encounter for therapeutic drug level monitoring: Secondary | ICD-10-CM | POA: Diagnosis not present

## 2017-03-01 LAB — POCT INR: INR: 2

## 2017-03-01 NOTE — Patient Instructions (Addendum)
Description   Continue taking 1 tablet everyday except 1/2 tablet on Tuesdays, Thursdays and Saturdays. Recheck in 3 weeks.  Call if placed on any new medications [phone].

## 2017-04-24 NOTE — Progress Notes (Signed)
Cardiology Office Note    Date:  04/24/2017   ID:  Kathy Montgomery, DOB 1945/06/14, MRN [id number]  PCP:  Seward Carol, MD  Cardiologist: Sinclair Grooms, MD   No chief complaint on file.   History of Present Illness:  Kathy Montgomery is a 72 y.o. female here for follow-up of paroxysmal atrial fibrillation, obstructive sleep apnea on CPAP and chronic anticoagulation therapy.  [name] is doing well.  She has not had syncope.  She denies bleeding, orthopnea, palpitations, chest pain, neurological complaints.  She feels much better after starting CPAP.   Past Medical History:  Diagnosis Date  . Atrial fibrillation ([place]) 11/14/2012  . Hypercholesteremia   . Hypertension   . Long term (current) use of anticoagulants 12/17/2012   Atrial fibrillation   . MI (myocardial infarction) [facility])     Past Surgical History:  Procedure Laterality Date  . ECTOPIC PREGNANCY SURGERY      Current Medications: Outpatient Medications Prior to Visit  Medication Sig Dispense Refill  . albuterol (PROAIR HFA) 108 (90 Base) MCG/ACT inhaler Inhale 2 puffs into the lungs every 4 (four) hours as needed for wheezing or shortness of breath.    Marland Kitchen atorvastatin (LIPITOR) 40 MG tablet Take 1 tablet (40 mg total) by mouth at bedtime. 90 tablet 3  . diltiazem (CARDIZEM CD) 120 MG 24 hr capsule Take 120 mg by mouth daily.    . fluticasone (FLONASE) 50 MCG/ACT nasal spray Place 1 spray into both nostrils daily. 16 g 2  . furosemide (LASIX) 40 MG tablet TAKE 1 TABLET(40 MG) BY MOUTH DAILY 30 tablet 3  . losartan (COZAAR) 100 MG tablet TAKE 1 TABLET(100 MG) BY MOUTH DAILY 90 tablet 3  . spironolactone (ALDACTONE) 25 MG tablet Take 1 tablet (25 mg total) by mouth daily. 90 tablet 3  . warfarin (COUMADIN) 5 MG tablet Take 5 mg by mouth daily.     Facility-Administered Medications Prior to Visit  Medication Dose Route Frequency Provider Last Rate Last Dose  . albuterol (PROVENTIL HFA;VENTOLIN HFA) 108 (90 Base)  MCG/ACT inhaler 2 puff  2 puff Inhalation Q6H PRN Isaiah Serge, NP         Allergies:   Patient has no known allergies.   Social History   Socioeconomic History  . Marital status: Single    Spouse name: Not on file  . Number of children: Not on file  . Years of education: Not on file  . Highest education level: Not on file  Social Needs  . Financial resource strain: Not on file  . Food insecurity - worry: Not on file  . Food insecurity - inability: Not on file  . Transportation needs - medical: Not on file  . Transportation needs - non-medical: Not on file  Occupational History  . Not on file  Tobacco Use  . Smoking status: Former Smoker    Types: Cigarettes    Last attempt to quit: 05/17/2000    Years since quitting: 16.9  . Smokeless tobacco: Never Used  Substance and Sexual Activity  . Alcohol use: No  . Drug use: No  . Sexual activity: No  Other Topics Concern  . Not on file  Social History Narrative  . Not on file     Family History:  The patient's family history includes Esophageal cancer in her sister; Stroke in her mother.   ROS:   Please see the history of present illness.    None  All other  systems reviewed and are negative.   PHYSICAL EXAM:   VS:  There were no vitals taken for this visit.   GEN: Well nourished, well developed, in no acute distress  HEENT: normal  Neck: no JVD, carotid bruits, or masses Cardiac: RRR; no murmurs, rubs, or gallops,no edema  Respiratory:  clear to auscultation bilaterally, normal work of breathing GI: soft, nontender, nondistended, + BS MS: no deformity or atrophy  Skin: warm and dry, no rash Neuro:  Alert and Oriented x 3, Strength and sensation are intact Psych: euthymic mood, full affect  Wt Readings from Last 3 Encounters:  01/26/17 222 lb 1.9 oz (100.8 kg)  12/08/16 225 lb (102.1 kg)  03/22/16 215 lb 12.8 oz (97.9 kg)      Studies/Labs Reviewed:   EKG:  EKG none  Recent Labs: 12/21/2016: BUN 18;  Creatinine, Ser 1.18; Potassium 4.3; Sodium 141 02/01/2017: NT-Pro BNP 60   Lipid Panel    Component Value Date/Time   CHOL 158 05/09/2007 2043   TRIG 97 05/09/2007 2043   HDL 45 05/09/2007 2043   CHOLHDL 3.5 Ratio 05/09/2007 2043   VLDL 19 05/09/2007 2043   LDLCALC 94 05/09/2007 2043    Additional studies/ records that were reviewed today include:  No new data    ASSESSMENT:    1. Chronic anticoagulation   2. HYPERTENSION, BENIGN SYSTEMIC   3. Chronic atrial fibrillation ([place])   4. Chronic diastolic heart failure ([place])   5. Arteriosclerosis of coronary artery      PLAN:  In order of problems listed above:  1. No complications on Coumadin 2. Blood pressure is elevated today but she has not had antihypertensive medicine prior to coming in.  We did discuss that she observe/own her target blood pressure 130/80 mmHg. 3. No episodes of atrial fibrillation which is paroxysmal. 4. No evidence of volume overload. 5. Asymptomatic.  Clinical follow-up in 6-9 months.  No change in therapy.  Cautioned to observe for bleeding.    Medication Adjustments/Labs and Tests Ordered: Current medicines are reviewed at length with the patient today.  Concerns regarding medicines are outlined above.  Medication changes, Labs and Tests ordered today are listed in the Patient Instructions below. There are no Patient Instructions on file for this visit.   Signed, Sinclair Grooms, MD  04/24/2017 6:16 PM    [facility] [place], [place], [place]  [address] Phone: [phone]; Fax: [phone]

## 2017-04-25 ENCOUNTER — Ambulatory Visit: Payer: Medicare Other | Admitting: Interventional Cardiology

## 2017-04-25 ENCOUNTER — Ambulatory Visit (INDEPENDENT_AMBULATORY_CARE_PROVIDER_SITE_OTHER): Payer: Medicare Other | Admitting: Pharmacist

## 2017-04-25 ENCOUNTER — Encounter: Payer: Self-pay | Admitting: Interventional Cardiology

## 2017-04-25 VITALS — BP 156/92 | HR 70 | Ht 63.0 in | Wt 220.0 lb

## 2017-04-25 DIAGNOSIS — I10 Essential (primary) hypertension: Secondary | ICD-10-CM

## 2017-04-25 DIAGNOSIS — I482 Chronic atrial fibrillation, unspecified: Secondary | ICD-10-CM

## 2017-04-25 DIAGNOSIS — Z5181 Encounter for therapeutic drug level monitoring: Secondary | ICD-10-CM | POA: Diagnosis not present

## 2017-04-25 DIAGNOSIS — I5032 Chronic diastolic (congestive) heart failure: Secondary | ICD-10-CM | POA: Diagnosis not present

## 2017-04-25 DIAGNOSIS — Z7901 Long term (current) use of anticoagulants: Secondary | ICD-10-CM | POA: Diagnosis not present

## 2017-04-25 DIAGNOSIS — I2510 Atherosclerotic heart disease of native coronary artery without angina pectoris: Secondary | ICD-10-CM

## 2017-04-25 LAB — POCT INR: INR: 1.8

## 2017-04-25 NOTE — Patient Instructions (Signed)
Description   Today take 1.5 tablets then Continue taking 1 tablet everyday except 1/2 tablet on Tuesdays, Thursdays and Saturdays. Recheck in 3 weeks.  Call if placed on any new medications [phone].

## 2017-04-25 NOTE — Patient Instructions (Signed)
Medication Instructions:  Your physician recommends that you continue on your current medications as directed. Please refer to the Current Medication list given to you today.  Follow-Up: Your physician wants you to follow-up in 9 to 12 months with Dr. Tamala Julian. You will receive a reminder letter in the mail two months in advance. If you don't receive a letter, please call our office to schedule the follow-up appointment.   If you need a refill on your cardiac medications before your next appointment, please call your pharmacy.

## 2017-04-30 ENCOUNTER — Other Ambulatory Visit: Payer: Self-pay | Admitting: Interventional Cardiology

## 2017-05-17 ENCOUNTER — Ambulatory Visit (INDEPENDENT_AMBULATORY_CARE_PROVIDER_SITE_OTHER): Payer: Medicare Other | Admitting: Pharmacist

## 2017-05-17 DIAGNOSIS — I482 Chronic atrial fibrillation, unspecified: Secondary | ICD-10-CM

## 2017-05-17 DIAGNOSIS — Z5181 Encounter for therapeutic drug level monitoring: Secondary | ICD-10-CM

## 2017-05-17 LAB — POCT INR: INR: 2.5

## 2017-05-17 NOTE — Patient Instructions (Signed)
Description   Continue taking 1 tablet everyday except 1/2 tablet on Tuesdays, Thursdays and Saturdays. Then follow instructions below for hold for procedure.  Recheck in 2 weeks post procedure.  Call if placed on any new medications [phone].

## 2017-06-04 ENCOUNTER — Other Ambulatory Visit: Payer: Self-pay | Admitting: General Surgery

## 2017-06-19 ENCOUNTER — Ambulatory Visit (INDEPENDENT_AMBULATORY_CARE_PROVIDER_SITE_OTHER): Payer: Medicare Other | Admitting: *Deleted

## 2017-06-19 DIAGNOSIS — I482 Chronic atrial fibrillation, unspecified: Secondary | ICD-10-CM

## 2017-06-19 DIAGNOSIS — Z5181 Encounter for therapeutic drug level monitoring: Secondary | ICD-10-CM | POA: Diagnosis not present

## 2017-06-19 LAB — POCT INR: INR: 2

## 2017-06-19 NOTE — Patient Instructions (Signed)
Description   Today take 1 tablet, then Continue taking 1 tablet everyday except 1/2 tablet on Tuesdays, Thursdays and Saturdays.   Recheck in 4 weeks.  Call if placed on any new medications [phone].

## 2017-06-29 ENCOUNTER — Other Ambulatory Visit: Payer: Self-pay | Admitting: Interventional Cardiology

## 2017-07-17 ENCOUNTER — Ambulatory Visit (INDEPENDENT_AMBULATORY_CARE_PROVIDER_SITE_OTHER): Payer: Medicare Other

## 2017-07-17 DIAGNOSIS — I482 Chronic atrial fibrillation, unspecified: Secondary | ICD-10-CM

## 2017-07-17 DIAGNOSIS — Z5181 Encounter for therapeutic drug level monitoring: Secondary | ICD-10-CM | POA: Diagnosis not present

## 2017-07-17 LAB — POCT INR: INR: 3.9 — AB (ref 2.0–3.0)

## 2017-07-17 NOTE — Patient Instructions (Signed)
Description   Skip today's dosage of Coumadin, then resume same dosage 1 tablet everyday except 1/2 tablet on Tuesdays, Thursdays and Saturdays.   Recheck in 2 weeks.  Call if placed on any new medications [phone].

## 2017-07-31 ENCOUNTER — Ambulatory Visit (INDEPENDENT_AMBULATORY_CARE_PROVIDER_SITE_OTHER): Payer: Medicare Other | Admitting: *Deleted

## 2017-07-31 ENCOUNTER — Telehealth: Payer: Self-pay | Admitting: Interventional Cardiology

## 2017-07-31 DIAGNOSIS — I482 Chronic atrial fibrillation, unspecified: Secondary | ICD-10-CM

## 2017-07-31 DIAGNOSIS — Z5181 Encounter for therapeutic drug level monitoring: Secondary | ICD-10-CM | POA: Diagnosis not present

## 2017-07-31 LAB — POCT INR: INR: 3.2 — AB (ref 2.0–3.0)

## 2017-07-31 NOTE — Telephone Encounter (Signed)
New message       Pt was seen today for coumadin check.  She request a appt with Dr Tamala Julian because she is on coumadin and is having blood in her urine.  Her PCP did not do anything and pt is worried that her blood thinner may need to be changed or something.  She is scheduled to see Cecilie Kicks on July 3rd but want to talk to the nurse first.  Please call.

## 2017-07-31 NOTE — Telephone Encounter (Signed)
Left message to call back  

## 2017-07-31 NOTE — Patient Instructions (Signed)
Description   Skip today's dosage of Coumadin, then start taking 1/2 tablet everyday except 1 tablet on Mondays, Wednesdays, and Fridays.  Recheck in 2 weeks.  Call if placed on any new medications [phone].

## 2017-07-31 NOTE — Telephone Encounter (Signed)
Spoke with pt and she states that about a week ago she thought she noticed a light pink tinge on the toilet paper when she wiped.  Went to PCP a few days ago and had UA that confirmed blood in her urine.  They recommended she see GYN.  Pt states she never mentioned changing her medication, she wants to stay on Coumadin.  Pt has already reached out to GYN.  Advised her to see them and see [place] as planned 7/3.  Spoke with Dr. Tamala Julian and no further recommendations.  Pt verbalized understanding and was appreciative for call.

## 2017-08-14 NOTE — Progress Notes (Signed)
Cardiology Office Note   Date:  08/15/2017   ID:  Kathy Montgomery, DOB 1946/01/01, MRN [id number]  PCP:  Seward Carol, MD  Cardiologist:  Dr. Tamala Julian    Chief Complaint  Patient presents with  . Atrial Fibrillation      History of Present Illness: Kathy Montgomery is a 72 y.o. female who presents for PAF.    She has a hx of paroxysmal  a fib  atrial fibrillation, obstructive sleep apnea on CPAP and chronic anticoagulation therapy.  She is doing well on her CPAP and feels more rested.  She has the nasal pillow.  She has no chest pain or SOB. No syncope.  She may have irregular HR for brief time on rare occ.   She has had recent hematuria.  Followed by Dr. Delfina Redwood.  I have asked her to check with Dr. Delfina Redwood if urology consult needed since she is on coumadin.  Labs are followed by Dr. Delfina Redwood.    Past Medical History:  Diagnosis Date  . Atrial fibrillation ([place]) 11/14/2012  . Hypercholesteremia   . Hypertension   . Long term (current) use of anticoagulants 12/17/2012   Atrial fibrillation   . MI (myocardial infarction) [facility])     Past Surgical History:  Procedure Laterality Date  . ECTOPIC PREGNANCY SURGERY       Current Outpatient Medications  Medication Sig Dispense Refill  . albuterol (PROAIR HFA) 108 (90 Base) MCG/ACT inhaler Inhale 2 puffs into the lungs every 4 (four) hours as needed for wheezing or shortness of breath.    Marland Kitchen atorvastatin (LIPITOR) 40 MG tablet Take 1 tablet (40 mg total) by mouth at bedtime. 90 tablet 3  . diltiazem (CARDIZEM CD) 120 MG 24 hr capsule Take 120 mg by mouth daily.    . furosemide (LASIX) 40 MG tablet TAKE 1 TABLET(40 MG) BY MOUTH DAILY 30 tablet 9  . losartan (COZAAR) 100 MG tablet TAKE 1 TABLET(100 MG) BY MOUTH DAILY 90 tablet 3  . spironolactone (ALDACTONE) 25 MG tablet Take 25 mg by mouth daily.    Marland Kitchen warfarin (COUMADIN) 5 MG tablet TAKE 1 TABLET BY MOUTH AS DIRECTED 90 tablet 0   Current Facility-Administered Medications  Medication  Dose Route Frequency Provider Last Rate Last Dose  . albuterol (PROVENTIL HFA;VENTOLIN HFA) 108 (90 Base) MCG/ACT inhaler 2 puff  2 puff Inhalation Q6H PRN Isaiah Serge, NP        Allergies:   Patient has no known allergies.    Social History:  The patient  reports that she quit smoking about 17 years ago. Her smoking use included cigarettes. She has never used smokeless tobacco. She reports that she does not drink alcohol or use drugs.   Family History:  The patient's family history includes Esophageal cancer in her sister; Stroke in her mother.    ROS:  General:no colds or fevers, no weight changes Skin:no rashes or ulcers HEENT:no blurred vision, no congestion CV:see HPI PUL:see HPI GI:no diarrhea constipation or melena, no indigestion GU:no hematuria, no dysuria MS:no joint pain, no claudication Neuro:no syncope, no lightheadedness Endo:no diabetes, no thyroid disease  Wt Readings from Last 3 Encounters:  08/15/17 219 lb 12.8 oz (99.7 kg)  04/25/17 220 lb (99.8 kg)  01/26/17 222 lb 1.9 oz (100.8 kg)     PHYSICAL EXAM: VS:  BP (!) 128/58   Pulse (!) 51   Ht 5\' 3"  (1.6 m)   Wt 219 lb 12.8 oz (99.7 kg)  SpO2 97%   BMI 38.94 kg/m  , BMI Body mass index is 38.94 kg/m. General:Pleasant affect, NAD Skin:Warm and dry, brisk capillary refill HEENT:normocephalic, sclera clear, mucus membranes moist Neck:supple, no JVD, no bruits  Heart:S1S2 RRR without murmur, gallup, rub or click Lungs:clear without rales, rhonchi, or wheezes TTS:VXBL, non tender, + BS, do not palpate liver spleen or masses Ext:no lower ext edema, 2+ pedal pulses, 2+ radial pulses Neuro:alert and oriented X 3, MAE, follows commands, + facial symmetry    EKG:  EKG is NOT ordered today.    Recent Labs: 12/21/2016: BUN 18; Creatinine, Ser 1.18; Potassium 4.3; Sodium 141 02/01/2017: NT-Pro BNP 60    Lipid Panel    Component Value Date/Time   CHOL 158 05/09/2007 2043   TRIG 97 05/09/2007 2043     HDL 45 05/09/2007 2043   CHOLHDL 3.5 Ratio 05/09/2007 2043   VLDL 19 05/09/2007 2043   LDLCALC 94 05/09/2007 2043       Other studies Reviewed: Additional studies/ records that were reviewed today include: . Study Conclusions  - Left ventricle: The cavity size was normal. There was mild   concentric hypertrophy. Systolic function was vigorous. The   estimated ejection fraction was in the range of 65% to 70%. Wall   motion was normal; there were no regional wall motion   abnormalities. Left ventricular diastolic function parameters   were normal. - Aortic root: The aortic root was normal in size. - Mitral valve: There was trivial regurgitation. - Right ventricle: The cavity size was normal. Wall thickness was   normal. Systolic function was normal. - Pulmonic valve: There was trivial regurgitation. - Pulmonary arteries: Systolic pressure was within the normal   range. - Inferior vena cava: The vessel was normal in size.  Impressions:  - Normal study.  ASSESSMENT AND PLAN:  1.  PAF on exam sounds SR.    2.  Hematuria, per PCP, pt is to see GYN but have also asked her to check with Dr. Delfina Redwood about urology consult.  Unsure is her urine was positive for hematuria or just pink tinge on paper.  Follow up with Dr. Tamala Julian in 6 months.   3.  Anticoagulation stable, we follow coumadin.    4.  Chronic diastolic HF -euvolemic today      Current medicines are reviewed with the patient today.  The patient Has no concerns regarding medicines.  The following changes have been made:  See above Labs/ tests ordered today include:see above  Disposition:   FU:  see above  Signed, Cecilie Kicks, NP  08/15/2017 11:44 AM    [facility], [place], [place], [place] Phone: [phone]; Fax: [phone]

## 2017-08-15 ENCOUNTER — Encounter: Payer: Self-pay | Admitting: Cardiology

## 2017-08-15 ENCOUNTER — Ambulatory Visit (INDEPENDENT_AMBULATORY_CARE_PROVIDER_SITE_OTHER): Payer: Medicare Other | Admitting: Pharmacist

## 2017-08-15 ENCOUNTER — Ambulatory Visit: Payer: Medicare Other | Admitting: Cardiology

## 2017-08-15 VITALS — BP 128/58 | HR 51 | Ht 63.0 in | Wt 219.8 lb

## 2017-08-15 DIAGNOSIS — I482 Chronic atrial fibrillation, unspecified: Secondary | ICD-10-CM

## 2017-08-15 DIAGNOSIS — Z7901 Long term (current) use of anticoagulants: Secondary | ICD-10-CM

## 2017-08-15 DIAGNOSIS — I480 Paroxysmal atrial fibrillation: Secondary | ICD-10-CM

## 2017-08-15 DIAGNOSIS — Z5181 Encounter for therapeutic drug level monitoring: Secondary | ICD-10-CM

## 2017-08-15 DIAGNOSIS — R319 Hematuria, unspecified: Secondary | ICD-10-CM | POA: Diagnosis not present

## 2017-08-15 LAB — POCT INR: INR: 2.9 (ref 2.0–3.0)

## 2017-08-15 MED ORDER — ALBUTEROL SULFATE HFA 108 (90 BASE) MCG/ACT IN AERS: 2.0000 | INHALATION_SPRAY | RESPIRATORY_TRACT | 3 refills | Status: DC | PRN

## 2017-08-15 NOTE — Patient Instructions (Signed)
Medication Instructions: Your physician recommends that you continue on your current medications as directed. Please refer to the Current Medication list given to you today.   Labwork: None  Procedures/Testing: None  Follow-Up: Your physician recommends that you schedule a follow-up appointment in: 6 months with Dr.Smith  Any Additional Special Instructions Will Be Listed Below (If Applicable).     If you need a refill on your cardiac medications before your next appointment, please call your pharmacy.

## 2017-08-15 NOTE — Patient Instructions (Signed)
Description   Continue taking 1/2 tablet every day except 1 tablet on Mondays, Wednesdays, and Fridays.  Recheck in 3 weeks.  Call if placed on any new medications [phone].

## 2017-08-28 ENCOUNTER — Other Ambulatory Visit: Payer: Self-pay | Admitting: Interventional Cardiology

## 2017-09-07 ENCOUNTER — Ambulatory Visit (INDEPENDENT_AMBULATORY_CARE_PROVIDER_SITE_OTHER): Payer: Medicare Other

## 2017-09-07 DIAGNOSIS — I482 Chronic atrial fibrillation, unspecified: Secondary | ICD-10-CM

## 2017-09-07 DIAGNOSIS — Z5181 Encounter for therapeutic drug level monitoring: Secondary | ICD-10-CM

## 2017-09-07 LAB — POCT INR: INR: 3 (ref 2.0–3.0)

## 2017-09-07 NOTE — Patient Instructions (Signed)
Description   Take 1/2 tablet today, then resume same dosage 1/2 tablet every day except 1 tablet on Mondays, Wednesdays, and Fridays.  Recheck in 4 weeks.  Call if placed on any new medications [phone].

## 2017-10-17 ENCOUNTER — Encounter (INDEPENDENT_AMBULATORY_CARE_PROVIDER_SITE_OTHER): Payer: Self-pay

## 2017-10-17 ENCOUNTER — Ambulatory Visit: Payer: Medicare Other | Admitting: *Deleted

## 2017-10-17 DIAGNOSIS — I482 Chronic atrial fibrillation, unspecified: Secondary | ICD-10-CM

## 2017-10-17 DIAGNOSIS — Z5181 Encounter for therapeutic drug level monitoring: Secondary | ICD-10-CM | POA: Diagnosis not present

## 2017-10-17 LAB — POCT INR: INR: 2.7 (ref 2.0–3.0)

## 2017-10-17 NOTE — Patient Instructions (Signed)
Description   Continue taking the same dosage 1/2 tablet every day except 1 tablet on Mondays, Wednesdays, and Fridays.  Recheck in 4 weeks.  Call if placed on any new medications [phone].

## 2017-10-24 ENCOUNTER — Other Ambulatory Visit: Payer: Self-pay | Admitting: Obstetrics and Gynecology

## 2017-10-24 DIAGNOSIS — N6310 Unspecified lump in the right breast, unspecified quadrant: Secondary | ICD-10-CM

## 2017-10-30 ENCOUNTER — Inpatient Hospital Stay: Admission: RE | Admit: 2017-10-30 | Payer: Medicare Other | Source: Ambulatory Visit

## 2017-10-30 ENCOUNTER — Other Ambulatory Visit: Payer: Self-pay | Admitting: Obstetrics and Gynecology

## 2017-10-30 ENCOUNTER — Ambulatory Visit
Admission: RE | Admit: 2017-10-30 | Discharge: 2017-10-30 | Disposition: A | Discharge status: Home / Self Care | Payer: Medicare Other | Source: Ambulatory Visit | Attending: Obstetrics and Gynecology | Admitting: Obstetrics and Gynecology

## 2017-10-30 DIAGNOSIS — N63 Unspecified lump in unspecified breast: Secondary | ICD-10-CM

## 2017-10-30 DIAGNOSIS — N6310 Unspecified lump in the right breast, unspecified quadrant: Secondary | ICD-10-CM

## 2017-11-19 ENCOUNTER — Ambulatory Visit: Payer: Medicare Other | Admitting: *Deleted

## 2017-11-19 DIAGNOSIS — I482 Chronic atrial fibrillation, unspecified: Secondary | ICD-10-CM

## 2017-11-19 DIAGNOSIS — Z5181 Encounter for therapeutic drug level monitoring: Secondary | ICD-10-CM | POA: Diagnosis not present

## 2017-11-19 LAB — POCT INR: INR: 2 (ref 2.0–3.0)

## 2017-11-19 NOTE — Patient Instructions (Signed)
Description   Continue taking the same dosage 1/2 tablet every day except 1 tablet on Mondays, Wednesdays, and Fridays.  Recheck in 5 weeks.  Call if placed on any new medications #[phone].       

## 2017-12-16 ENCOUNTER — Other Ambulatory Visit: Payer: Self-pay | Admitting: Interventional Cardiology

## 2017-12-25 ENCOUNTER — Ambulatory Visit (INDEPENDENT_AMBULATORY_CARE_PROVIDER_SITE_OTHER): Payer: Medicare Other | Admitting: *Deleted

## 2017-12-25 DIAGNOSIS — I482 Chronic atrial fibrillation, unspecified: Secondary | ICD-10-CM | POA: Diagnosis not present

## 2017-12-25 DIAGNOSIS — Z5181 Encounter for therapeutic drug level monitoring: Secondary | ICD-10-CM

## 2017-12-25 LAB — POCT INR: INR: 2.1 (ref 2.0–3.0)

## 2017-12-25 NOTE — Progress Notes (Deleted)
Cardiology Office Note   Date:  12/25/2017   ID:  Kathy Montgomery, DOB 1945-05-16, MRN [id number]  PCP:  Kathy Carol, MD  Cardiologist:  Dr. Tamala Julian     No chief complaint on file.     History of Present Illness: Kathy Montgomery is a 72 y.o. female who presents for ***  hx of paroxysmal  a fib  atrial fibrillation,obstructive sleep apnea on CPAPand chronic anticoagulation therapy.   Past Medical History:  Diagnosis Date  . Atrial fibrillation ([place]) 11/14/2012  . Hypercholesteremia   . Hypertension   . Long term (current) use of anticoagulants 12/17/2012   Atrial fibrillation   . MI (myocardial infarction) [facility])     Past Surgical History:  Procedure Laterality Date  . ECTOPIC PREGNANCY SURGERY       Current Outpatient Medications  Medication Sig Dispense Refill  . albuterol (PROAIR HFA) 108 (90 Base) MCG/ACT inhaler Inhale 2 puffs into the lungs every 4 (four) hours as needed for wheezing or shortness of breath. 1 Inhaler 3  . atorvastatin (LIPITOR) 40 MG tablet Take 1 tablet (40 mg total) by mouth at bedtime. 90 tablet 3  . CARTIA XT 120 MG 24 hr capsule TAKE 1 CAPSULE(120 MG) BY MOUTH DAILY 90 capsule 3  . diltiazem (CARDIZEM CD) 120 MG 24 hr capsule Take 120 mg by mouth daily.    . furosemide (LASIX) 40 MG tablet TAKE 1 TABLET(40 MG) BY MOUTH DAILY 30 tablet 9  . spironolactone (ALDACTONE) 25 MG tablet TAKE 1 TABLET(25 MG) BY MOUTH DAILY 90 tablet 2  . warfarin (COUMADIN) 5 MG tablet TAKE 1/2 TO 1 TABLET BY MOUTH DAILY AS DIRECTED 75 tablet 0   Current Facility-Administered Medications  Medication Dose Route Frequency Provider Last Rate Last Dose  . albuterol (PROVENTIL HFA;VENTOLIN HFA) 108 (90 Base) MCG/ACT inhaler 2 puff  2 puff Inhalation Q6H PRN Isaiah Serge, NP        Allergies:   Patient has no known allergies.    Social History:  The patient  reports that she quit smoking about 17 years ago. Her smoking use included cigarettes. She has never  used smokeless tobacco. She reports that she does not drink alcohol or use drugs.   Family History:  The patient's ***family history includes Esophageal cancer in her sister; Stroke in her mother.    ROS:  General:no colds or fevers, no weight changes Skin:no rashes or ulcers HEENT:no blurred vision, no congestion CV:see HPI PUL:see HPI GI:no diarrhea constipation or melena, no indigestion GU:no hematuria, no dysuria MS:no joint pain, no claudication Neuro:no syncope, no lightheadedness Endo:no diabetes, no thyroid disease Wt Readings from Last 3 Encounters:  08/15/17 219 lb 12.8 oz (99.7 kg)  04/25/17 220 lb (99.8 kg)  01/26/17 222 lb 1.9 oz (100.8 kg)     PHYSICAL EXAM: VS:  There were no vitals taken for this visit. , BMI There is no height or weight on file to calculate BMI. General:Pleasant affect, NAD Skin:Warm and dry, brisk capillary refill HEENT:normocephalic, sclera clear, mucus membranes moist Neck:supple, no JVD, no bruits  Heart:S1S2 RRR without murmur, gallup, rub or click Lungs:clear without rales, rhonchi, or wheezes MHD:QQIW, non tender, + BS, do not palpate liver spleen or masses Ext:no lower ext edema, 2+ pedal pulses, 2+ radial pulses Neuro:alert and oriented, MAE, follows commands, + facial symmetry    EKG:  EKG is ordered today. The ekg ordered today demonstrates ***   Recent Labs: 02/01/2017: NT-Pro BNP 60  Lipid Panel    Component Value Date/Time   CHOL 158 05/09/2007 2043   TRIG 97 05/09/2007 2043   HDL 45 05/09/2007 2043   CHOLHDL 3.5 Ratio 05/09/2007 2043   VLDL 19 05/09/2007 2043   LDLCALC 94 05/09/2007 2043       Other studies Reviewed: Additional studies/ records that were reviewed today include:  Echo : 02/15/17 Study Conclusions  - Left ventricle: The cavity size was normal. There was mild concentric hypertrophy. Systolic function was vigorous. The estimated ejection fraction was in the range of 65% to 70%.  Wall motion was normal; there were no regional wall motion abnormalities. Left ventricular diastolic function parameters were normal. - Aortic root: The aortic root was normal in size. - Mitral valve: There was trivial regurgitation. - Right ventricle: The cavity size was normal. Wall thickness was normal. Systolic function was normal. - Pulmonic valve: There was trivial regurgitation. - Pulmonary arteries: Systolic pressure was within the normal range. - Inferior vena cava: The vessel was normal in size.  Impressions:  - Normal study.  ASSESSMENT AND PLAN:  1.  ***   Current medicines are reviewed with the patient today.  The patient Has no concerns regarding medicines.  The following changes have been made:  See above Labs/ tests ordered today include:see above  Disposition:   FU:  see above  Signed, Kathy Kicks, NP  12/25/2017 2:38 PM    Kathy Montgomery, Kathy Montgomery, Kathy [place], [place] Phone: [phone]; Fax: [phone]

## 2017-12-25 NOTE — Patient Instructions (Signed)
Description   Continue taking the same dosage 1/2 tablet every day except 1 tablet on Mondays, Wednesdays, and Fridays.  Recheck in 5 weeks.  Call if placed on any new medications [phone].

## 2017-12-27 ENCOUNTER — Ambulatory Visit: Payer: Medicare Other | Admitting: Cardiology

## 2017-12-31 ENCOUNTER — Encounter: Payer: Self-pay | Admitting: Cardiology

## 2017-12-31 ENCOUNTER — Ambulatory Visit: Payer: Medicare Other | Admitting: Cardiology

## 2017-12-31 VITALS — BP 130/72 | HR 62 | Ht 63.0 in | Wt 221.2 lb

## 2017-12-31 DIAGNOSIS — I480 Paroxysmal atrial fibrillation: Secondary | ICD-10-CM | POA: Diagnosis not present

## 2017-12-31 DIAGNOSIS — M791 Myalgia, unspecified site: Secondary | ICD-10-CM | POA: Diagnosis not present

## 2017-12-31 DIAGNOSIS — I2510 Atherosclerotic heart disease of native coronary artery without angina pectoris: Secondary | ICD-10-CM

## 2017-12-31 DIAGNOSIS — I10 Essential (primary) hypertension: Secondary | ICD-10-CM | POA: Diagnosis not present

## 2017-12-31 DIAGNOSIS — Z7901 Long term (current) use of anticoagulants: Secondary | ICD-10-CM

## 2017-12-31 NOTE — Progress Notes (Signed)
Cardiology Office Note   Date:  12/31/2017   ID:  Kathy Montgomery, DOB 06/08/1945, MRN [id number]  PCP:  Seward Carol, MD  Cardiologist:  Dr. Tamala Julian    Chief Complaint  Patient presents with  . Atrial Fibrillation    just not feeling well      History of Present Illness: Kathy Montgomery is a 72 y.o. female who presents for abnormal life scan- unfortunately she did not bring the results with her.  She will bring tomorrow for review.  She has a hx of paroxysmal  atrial fibrillation,obstructive sleep apnea on CPAPand chronic anticoagulation therapy.  She is doing well on her CPAP and feels more rested.  She has the nasal pillow.  She has no chest pain or SOB. No syncope.  She may have irregular HR for brief time on rare occ.   She has had recent hematuria.  Followed by Dr. Delfina Redwood. His urinalysis did not show hematuria.  she may have carotid disease.  I asked her to bring results of scan and we would order our own for abnormal issues.  Her last cr was 1.150, hgb A1C was 6.1 we discussed need to diet to prevent diabetes.  She is interested in [place]. Loss plan   She is aching all over and is worried, this may be statin, will hold,  No chest pain and no SOB  She is planning on moving back to [place] were her family including a twin sister live.  She has had some edema but she is on diuretic.     Past Medical History:  Diagnosis Date  . Atrial fibrillation ([place]) 11/14/2012  . Hypercholesteremia   . Hypertension   . Long term (current) use of anticoagulants 12/17/2012   Atrial fibrillation   . MI (myocardial infarction) [facility])     Past Surgical History:  Procedure Laterality Date  . ECTOPIC PREGNANCY SURGERY       Current Outpatient Medications  Medication Sig Dispense Refill  . albuterol (PROAIR HFA) 108 (90 Base) MCG/ACT inhaler Inhale 2 puffs into the lungs every 4 (four) hours as needed for wheezing or shortness of breath. 1 Inhaler 3  . atorvastatin (LIPITOR) 40 MG tablet  Take 1 tablet (40 mg total) by mouth at bedtime. 90 tablet 3  . CARTIA XT 120 MG 24 hr capsule TAKE 1 CAPSULE(120 MG) BY MOUTH DAILY 90 capsule 3  . furosemide (LASIX) 40 MG tablet TAKE 1 TABLET(40 MG) BY MOUTH DAILY 30 tablet 9  . spironolactone (ALDACTONE) 25 MG tablet TAKE 1 TABLET(25 MG) BY MOUTH DAILY 90 tablet 2  . warfarin (COUMADIN) 5 MG tablet TAKE 1/2 TO 1 TABLET BY MOUTH DAILY AS DIRECTED 75 tablet 0   Current Facility-Administered Medications  Medication Dose Route Frequency Provider Last Rate Last Dose  . albuterol (PROVENTIL HFA;VENTOLIN HFA) 108 (90 Base) MCG/ACT inhaler 2 puff  2 puff Inhalation Q6H PRN Isaiah Serge, NP        Allergies:   Patient has no known allergies.    Social History:  The patient  reports that she quit smoking about 17 years ago. Her smoking use included cigarettes. She has never used smokeless tobacco. She reports that she does not drink alcohol or use drugs.   Family History:  The patient's family history includes Esophageal cancer in her sister; Stroke in her mother.    ROS:  General:no colds or fevers, no weight changes Skin:no rashes or ulcers HEENT:no blurred vision, no congestion CV:see HPI  PUL:see HPI GI:no diarrhea constipation or melena, no indigestion GU:no hematuria, no dysuria MS:no joint pain, no claudication Neuro:no syncope, no lightheadedness Endo:no diabetes, no thyroid disease  Wt Readings from Last 3 Encounters:  12/31/17 221 lb 3.2 oz (100.3 kg)  08/15/17 219 lb 12.8 oz (99.7 kg)  04/25/17 220 lb (99.8 kg)     PHYSICAL EXAM: VS:  BP 130/72   Pulse 62   Ht 5\' 3"  (1.6 m)   Wt 221 lb 3.2 oz (100.3 kg)   SpO2 94%   BMI 39.18 kg/m  , BMI Body mass index is 39.18 kg/m. General:Pleasant affect, NAD Skin:Warm and dry, brisk capillary refill HEENT:normocephalic, sclera clear, mucus membranes moist Neck:supple, no JVD, no bruits  Heart:S1S2 RRR without murmur, gallup, rub or click Lungs:clear without rales,  rhonchi, or wheezes FFM:BWGY, non tender, + BS, do not palpate liver spleen or masses Ext:no lower ext edema, 2+ pedal pulses, 2+ radial pulses Neuro:alert and oriented X 3, MAE, follows commands, + facial symmetry    EKG:  EKG is ordered today. The ekg ordered today demonstrates SR no acute changes from prior EKG.    Recent Labs: 02/01/2017: NT-Pro BNP 60    Lipid Panel    Component Value Date/Time   CHOL 158 05/09/2007 2043   TRIG 97 05/09/2007 2043   HDL 45 05/09/2007 2043   CHOLHDL 3.5 Ratio 05/09/2007 2043   VLDL 19 05/09/2007 2043   LDLCALC 94 05/09/2007 2043       Other studies Reviewed: Additional studies/ records that were reviewed today include: . Echo 02/15/17 Study Conclusions  - Left ventricle: The cavity size was normal. There was mild concentric hypertrophy. Systolic function was vigorous. The estimated ejection fraction was in the range of 65% to 70%. Wall motion was normal; there were no regional wall motion abnormalities. Left ventricular diastolic function parameters were normal. - Aortic root: The aortic root was normal in size. - Mitral valve: There was trivial regurgitation. - Right ventricle: The cavity size was normal. Wall thickness was normal. Systolic function was normal. - Pulmonic valve: There was trivial regurgitation. - Pulmonary arteries: Systolic pressure was within the normal range. - Inferior vena cava: The vessel was normal in size.  Impressions:  - Normal study.  ASSESSMENT AND PLAN:  1.  Muscle pain - will hold statin for 2 weeks and see if any difference  If not resume statin.  Otherwise call us for changes.  2.  Abnormal life scan including carotids. She will bring by tomorrow and they can be scanned.  We may to do out own in the office.   3.  HTN controlled.  4.  PAF on anticoagulation with coumadin stable. She will keep follow up with Dr. Tamala Julian in Jan.     Current medicines are reviewed with the  patient today.  The patient Has no concerns regarding medicines.  The following changes have been made:  See above Labs/ tests ordered today include:see above  Disposition:   FU:  see above  Signed, Cecilie Kicks, NP  12/31/2017 4:57 PM    [address], [place], [place], [place] Phone: [phone]; Fax: [phone]

## 2017-12-31 NOTE — Patient Instructions (Signed)
Medication Instructions:  HOLD your Atorvastatin (Lipitor) for 2 weeks, If no improvement you may resume   If you need a refill on your cardiac medications before your next appointment, please call your pharmacy.   Lab work: None  If you have labs (blood work) drawn today and your tests are completely normal, you will receive your results only by: Marland Kitchen MyChart Message (if you have MyChart) OR . A paper copy in the mail If you have any lab test that is abnormal or we need to change your treatment, we will call you to review the results.  Testing/Procedures: None  Follow-Up: Keep follow up appointment with Dr. Tamala Julian on 02/15/2018 @ 10:00 AM  Any Other Special Instructions Will Be Listed Below (If Applicable).   The number to weight management is [phone]

## 2018-01-23 ENCOUNTER — Other Ambulatory Visit: Payer: Self-pay | Admitting: Cardiology

## 2018-01-23 DIAGNOSIS — I10 Essential (primary) hypertension: Secondary | ICD-10-CM

## 2018-01-23 DIAGNOSIS — I5032 Chronic diastolic (congestive) heart failure: Secondary | ICD-10-CM

## 2018-01-28 ENCOUNTER — Telehealth: Payer: Self-pay | Admitting: Interventional Cardiology

## 2018-01-28 DIAGNOSIS — I10 Essential (primary) hypertension: Secondary | ICD-10-CM

## 2018-01-28 DIAGNOSIS — I5032 Chronic diastolic (congestive) heart failure: Secondary | ICD-10-CM

## 2018-01-28 MED ORDER — LOSARTAN POTASSIUM 100 MG PO TABS: ORAL_TABLET | ORAL | 3 refills | Status: DC

## 2018-01-28 NOTE — Telephone Encounter (Signed)
Pt calling requesting a refill on losartan 100 mg tablet. This medication is no longer on the pt's medication list. I called pt and pt stated that she is still taking this medication and need it to sent in as soon as possible. Please address

## 2018-01-28 NOTE — Telephone Encounter (Signed)
°*  STAT* If patient is at the pharmacy, call can be transferred to refill team.   1. Which medications need to be refilled? (please list name of each medication and dose if known) losartan 100mg      2. Which pharmacy/location (including street and city if local pharmacy) is medication to be sent to?  [place], [address] [place]  3. Do they need a 30 day or 90 day supply? 90 days  [place]  Leaving tonight.

## 2018-01-28 NOTE — Telephone Encounter (Signed)
Looks like stopped in computer by accident.  So continue

## 2018-01-28 NOTE — Telephone Encounter (Signed)
Pt's medication was sent to pt's pharmacy as requested. Confirmation received.  °

## 2018-01-29 ENCOUNTER — Ambulatory Visit: Payer: Medicare Other | Admitting: *Deleted

## 2018-01-29 DIAGNOSIS — I482 Chronic atrial fibrillation, unspecified: Secondary | ICD-10-CM | POA: Diagnosis not present

## 2018-01-29 DIAGNOSIS — Z5181 Encounter for therapeutic drug level monitoring: Secondary | ICD-10-CM

## 2018-01-29 LAB — POCT INR: INR: 1.9 — AB (ref 2.0–3.0)

## 2018-01-29 NOTE — Patient Instructions (Signed)
Description   Today take 1 tablet then continue taking the same dosage 1/2 tablet every day except 1 tablet on Mondays, Wednesdays, and Fridays.  Recheck in 5 weeks.  Call if placed on any new medications [phone].

## 2018-01-31 ENCOUNTER — Ambulatory Visit
Admission: RE | Admit: 2018-01-31 | Discharge: 2018-01-31 | Disposition: A | Discharge status: Home / Self Care | Payer: Medicare Other | Source: Ambulatory Visit | Attending: Obstetrics and Gynecology | Admitting: Obstetrics and Gynecology

## 2018-01-31 DIAGNOSIS — N63 Unspecified lump in unspecified breast: Secondary | ICD-10-CM

## 2018-02-08 ENCOUNTER — Telehealth: Payer: Self-pay | Admitting: Cardiology

## 2018-02-08 DIAGNOSIS — I739 Peripheral vascular disease, unspecified: Secondary | ICD-10-CM

## 2018-02-08 DIAGNOSIS — I2510 Atherosclerotic heart disease of native coronary artery without angina pectoris: Secondary | ICD-10-CM

## 2018-02-08 DIAGNOSIS — I779 Disorder of arteries and arterioles, unspecified: Secondary | ICD-10-CM

## 2018-02-08 NOTE — Telephone Encounter (Signed)
Pt with abnormal Rt carotid disease on screening.  Lets do carotid dopplers with our office to evaluate.  Tell pt I reviewed her screening results and this and a BMP are all we need to eval.  Will call results.  Thanks.

## 2018-02-08 NOTE — Telephone Encounter (Signed)
Called pt re: message below. Left a message for pt to call back.  

## 2018-02-08 NOTE — Addendum Note (Signed)
Addended by: Gaetano Net on: 02/08/2018 04:34 PM   Modules accepted: Orders

## 2018-02-11 ENCOUNTER — Other Ambulatory Visit: Payer: Self-pay | Admitting: Cardiology

## 2018-02-11 DIAGNOSIS — I779 Disorder of arteries and arterioles, unspecified: Secondary | ICD-10-CM

## 2018-02-11 DIAGNOSIS — I6521 Occlusion and stenosis of right carotid artery: Secondary | ICD-10-CM

## 2018-02-11 DIAGNOSIS — I739 Peripheral vascular disease, unspecified: Secondary | ICD-10-CM

## 2018-02-13 NOTE — Progress Notes (Signed)
Cardiology Office Note:    Date:  02/15/2018   ID:  Kathy Montgomery, DOB Dec 18, 1945, MRN [id number]  PCP:  Seward Carol, MD  Cardiologist:  Sinclair Grooms, MD   Referring MD: Seward Carol, MD   Chief Complaint  Patient presents with  . Atrial Fibrillation    History of Present Illness:    Kathy Montgomery is a 73 y.o. female with a hx of paroxysmal atrial fibrillation, obstructive sleep apnea on CPAP and chronic anticoagulation therapy.  She is doing okay.  She is worried about the possibility of congestive heart failure because of lower extremity swelling.  She has had no prolonged palpitations episodes of A. fib.  She denies orthopnea and PND.  Had a Doppler study performed at her [facility] that demonstrated plaque in the carotids.  This was a qualitative study.  She has significant aches and pains on atorvastatin 40 mg/day.  Has been off medication now for 3 to 4 weeks and aching has resolved.  Past Medical History:  Diagnosis Date  . Atrial fibrillation ([place]) 11/14/2012  . Hypercholesteremia   . Hypertension   . Long term (current) use of anticoagulants 12/17/2012   Atrial fibrillation   . MI (myocardial infarction) [facility])     Past Surgical History:  Procedure Laterality Date  . ECTOPIC PREGNANCY SURGERY      Current Medications: Current Meds  Medication Sig  . albuterol (PROAIR HFA) 108 (90 Base) MCG/ACT inhaler Inhale 2 puffs into the lungs every 4 (four) hours as needed for wheezing or shortness of breath.  . CARTIA XT 120 MG 24 hr capsule TAKE 1 CAPSULE(120 MG) BY MOUTH DAILY  . furosemide (LASIX) 40 MG tablet TAKE 1 TABLET(40 MG) BY MOUTH DAILY  . losartan (COZAAR) 100 MG tablet TAKE 1 TABLET(100 MG) BY MOUTH DAILY  . spironolactone (ALDACTONE) 25 MG tablet TAKE 1 TABLET(25 MG) BY MOUTH DAILY  . warfarin (COUMADIN) 5 MG tablet TAKE 1/2 TO 1 TABLET BY MOUTH DAILY AS DIRECTED   Current Facility-Administered Medications for the 02/15/18 encounter (Office Visit)  with Belva Crome, MD  Medication  . albuterol (PROVENTIL HFA;VENTOLIN HFA) 108 (90 Base) MCG/ACT inhaler 2 puff     Allergies:   Patient has no known allergies.   Social History   Socioeconomic History  . Marital status: Single    Spouse name: Not on file  . Number of children: Not on file  . Years of education: Not on file  . Highest education level: Not on file  Occupational History  . Not on file  Social Needs  . Financial resource strain: Not on file  . Food insecurity:    Worry: Not on file    Inability: Not on file  . Transportation needs:    Medical: Not on file    Non-medical: Not on file  Tobacco Use  . Smoking status: Former Smoker    Types: Cigarettes    Last attempt to quit: 05/17/2000    Years since quitting: 17.7  . Smokeless tobacco: Never Used  Substance and Sexual Activity  . Alcohol use: No  . Drug use: No  . Sexual activity: Never  Lifestyle  . Physical activity:    Days per week: Not on file    Minutes per session: Not on file  . Stress: Not on file  Relationships  . Social connections:    Talks on phone: Not on file    Gets together: Not on file    Attends  religious service: Not on file    Active member of club or organization: Not on file    Attends meetings of clubs or organizations: Not on file    Relationship status: Not on file  Other Topics Concern  . Not on file  Social History Narrative  . Not on file     Family History: The patient's family history includes Esophageal cancer in her sister; Stroke in her mother.  ROS:   Please see the history of present illness.    Vision disturbance, leg swelling, wheezing, irregular heartbeat, muscle pain.  All other systems reviewed and are negative.  EKGs/Labs/Other Studies Reviewed:    The following studies were reviewed today: No new data  EKG:  EKG is not repeated  Recent Labs: No results found for requested labs within last 8760 hours.  Recent Lipid Panel    Component Value  Date/Time   CHOL 158 05/09/2007 2043   TRIG 97 05/09/2007 2043   HDL 45 05/09/2007 2043   CHOLHDL 3.5 Ratio 05/09/2007 2043   VLDL 19 05/09/2007 2043   LDLCALC 94 05/09/2007 2043    Physical Exam:    VS:  BP (!) 118/58   Pulse 60   Ht 5\' 3"  (1.6 m)   Wt 219 lb 3.2 oz (99.4 kg)   SpO2 96%   BMI 38.83 kg/m     Wt Readings from Last 3 Encounters:  02/15/18 219 lb 3.2 oz (99.4 kg)  12/31/17 221 lb 3.2 oz (100.3 kg)  08/15/17 219 lb 12.8 oz (99.7 kg)     GEN: Mild obesity. No acute distress HEENT: Normal NECK: No JVD. LYMPHATICS: No lymphadenopathy CARDIAC: RRR.  No murmur, gallop, edema VASCULAR: Pulses 2+ bilateral radial, Bruits absent in carotids RESPIRATORY:  Clear to auscultation without rales, wheezing or rhonchi  ABDOMEN: Soft, non-tender, non-distended, No pulsatile mass, MUSCULOSKELETAL: No deformity  SKIN: Warm and dry NEUROLOGIC:  Alert and oriented x 3 PSYCHIATRIC:  Normal affect   ASSESSMENT:    1. Chronic diastolic heart failure ([place])   2. Chronic atrial fibrillation   3. Chronic anticoagulation   4. HYPERTENSION, BENIGN SYSTEMIC   5. Hyperlipidemia with target LDL less than 70   6. Arteriosclerosis of coronary artery    PLAN:    In order of problems listed above:  1. No evidence of volume overload 2. Controlled heart rate.  Heart rate is consistent with sinus rhythm.  A. fib is paroxysmal and not chronic. 3. No significant bleeding on Coumadin. 4. Excellent blood pressure control. 5. Developed significant muscle aches on high intensity statin therapy, atorvastatin 40 mg/day.  Plan to reassess lipids off atorvastatin today.  Resume low-dose statin therapy and perhaps add ezetimibe to get LDL as low as possible. 6. Carotid Doppler study is being done today because a screening study demonstrated plaque in September 2019.  Plan to check LDL/lipid profile today.  Lower dose since she has significant muscle discomfort.  Consider switching to Crestor 5 or  10 mg/day.  May need ezetimibe.  Aerobic activity is encouraged.  Carotid Doppler is being done today  Medication Adjustments/Labs and Tests Ordered: Current medicines are reviewed at length with the patient today.  Concerns regarding medicines are outlined above.  Orders Placed This Encounter  Procedures  . Lipid panel   No orders of the defined types were placed in this encounter.   Patient Instructions  Medication Instructions:  Your physician recommends that you continue on your current medications as directed. Please refer to  the Current Medication list given to you today.  If you need a refill on your cardiac medications before your next appointment, please call your pharmacy.   Lab work: Lipid today  If you have labs (blood work) drawn today and your tests are completely normal, you will receive your results only by: Marland Kitchen MyChart Message (if you have MyChart) OR . A paper copy in the mail If you have any lab test that is abnormal or we need to change your treatment, we will call you to review the results.  Testing/Procedures: None  Follow-Up: At [facility], you and your health needs are our priority.  As part of our continuing mission to provide you with exceptional heart care, we have created designated Provider Care Teams.  These Care Teams include your primary Cardiologist (physician) and Advanced Practice Providers (APPs -  Physician Assistants and Nurse Practitioners) who all work together to provide you with the care you need, when you need it. You will need a follow up appointment in 12 months.  Please call our office 2 months in advance to schedule this appointment.  You may see Sinclair Grooms, MD or one of the following Advanced Practice Providers on your designated Care Team:   Truitt Merle, NP Cecilie Kicks, NP . Kathyrn Drown, NP  Any Other Special Instructions Will Be Listed Below (If Applicable).       Signed, Sinclair Grooms, MD  02/15/2018 11:16  AM    [place]

## 2018-02-15 ENCOUNTER — Encounter (INDEPENDENT_AMBULATORY_CARE_PROVIDER_SITE_OTHER): Payer: Self-pay

## 2018-02-15 ENCOUNTER — Ambulatory Visit (HOSPITAL_COMMUNITY)
Admission: RE | Admit: 2018-02-15 | Discharge: 2018-02-15 | Disposition: A | Discharge status: Home / Self Care | Payer: Medicare Other | Source: Ambulatory Visit | Attending: Cardiovascular Disease | Admitting: Cardiovascular Disease

## 2018-02-15 ENCOUNTER — Ambulatory Visit: Payer: Medicare Other | Admitting: Interventional Cardiology

## 2018-02-15 ENCOUNTER — Encounter: Payer: Self-pay | Admitting: Interventional Cardiology

## 2018-02-15 VITALS — BP 118/58 | HR 60 | Ht 63.0 in | Wt 219.2 lb

## 2018-02-15 DIAGNOSIS — I779 Disorder of arteries and arterioles, unspecified: Secondary | ICD-10-CM

## 2018-02-15 DIAGNOSIS — I739 Peripheral vascular disease, unspecified: Secondary | ICD-10-CM | POA: Diagnosis present

## 2018-02-15 DIAGNOSIS — I10 Essential (primary) hypertension: Secondary | ICD-10-CM | POA: Diagnosis not present

## 2018-02-15 DIAGNOSIS — I480 Paroxysmal atrial fibrillation: Secondary | ICD-10-CM

## 2018-02-15 DIAGNOSIS — Z7901 Long term (current) use of anticoagulants: Secondary | ICD-10-CM | POA: Diagnosis not present

## 2018-02-15 DIAGNOSIS — I2510 Atherosclerotic heart disease of native coronary artery without angina pectoris: Secondary | ICD-10-CM

## 2018-02-15 DIAGNOSIS — I5032 Chronic diastolic (congestive) heart failure: Secondary | ICD-10-CM | POA: Diagnosis not present

## 2018-02-15 DIAGNOSIS — I6521 Occlusion and stenosis of right carotid artery: Secondary | ICD-10-CM | POA: Diagnosis present

## 2018-02-15 DIAGNOSIS — E785 Hyperlipidemia, unspecified: Secondary | ICD-10-CM

## 2018-02-15 LAB — LIPID PANEL
CHOLESTEROL TOTAL: 225 mg/dL — AB (ref 100–199)
Chol/HDL Ratio: 5.4 ratio — ABNORMAL HIGH (ref 0.0–4.4)
HDL: 42 mg/dL (ref >39)
LDL Calculated: 160 mg/dL — ABNORMAL HIGH (ref 0–99)
TRIGLYCERIDES: 114 mg/dL (ref 0–149)
VLDL Cholesterol Cal: 23 mg/dL (ref 5–40)

## 2018-02-15 NOTE — Patient Instructions (Signed)
Medication Instructions:  Your physician recommends that you continue on your current medications as directed. Please refer to the Current Medication list given to you today.  If you need a refill on your cardiac medications before your next appointment, please call your pharmacy.   Lab work: Lipid today  If you have labs (blood work) drawn today and your tests are completely normal, you will receive your results only by: Marland Kitchen MyChart Message (if you have MyChart) OR . A paper copy in the mail If you have any lab test that is abnormal or we need to change your treatment, we will call you to review the results.  Testing/Procedures: None  Follow-Up: At [facility], you and your health needs are our priority.  As part of our continuing mission to provide you with exceptional heart care, we have created designated Provider Care Teams.  These Care Teams include your primary Cardiologist (physician) and Advanced Practice Providers (APPs -  Physician Assistants and Nurse Practitioners) who all work together to provide you with the care you need, when you need it. You will need a follow up appointment in 12 months.  Please call our office 2 months in advance to schedule this appointment.  You may see Sinclair Grooms, MD or one of the following Advanced Practice Providers on your designated Care Team:   Truitt Merle, NP Cecilie Kicks, NP . Kathyrn Drown, NP  Any Other Special Instructions Will Be Listed Below (If Applicable).

## 2018-02-18 ENCOUNTER — Telehealth: Payer: Self-pay | Admitting: *Deleted

## 2018-02-18 DIAGNOSIS — E7800 Pure hypercholesterolemia, unspecified: Secondary | ICD-10-CM

## 2018-02-18 MED ORDER — ROSUVASTATIN CALCIUM 5 MG PO TABS: 5.0000 mg | ORAL_TABLET | Freq: Every day | ORAL | 3 refills | Status: DC

## 2018-02-18 NOTE — Telephone Encounter (Signed)
Patient returned your call.

## 2018-02-18 NOTE — Telephone Encounter (Signed)
Spoke with pt and went over results and recommendations per Dr. Tamala Julian.  Pt will come 2/10 for labs.  Pt verbalized understanding and was in agreement with this plan.

## 2018-02-18 NOTE — Telephone Encounter (Signed)
Called pt re: lab results, left a message for her to call back  

## 2018-02-21 ENCOUNTER — Telehealth: Payer: Self-pay | Admitting: Interventional Cardiology

## 2018-02-21 NOTE — Telephone Encounter (Signed)
Pt c/o medication issue:  1. Name of Medication: rosuvastatin (CRESTOR) 5 MG tablet  2. How are you currently taking this medication (dosage and times per day)?  3. Are you having a reaction (difficulty breathing--STAT)?   4. What is your medication issue? She read that it should be taken with an anti-acid she would like to know if she should do that.  She wants to know before she states taking it.   If you can't reach her you can leave her a detailed message.

## 2018-02-22 NOTE — Telephone Encounter (Signed)
Late entry:  Spoke with pt 02/21/2018 and let her know that antiacids should be taken at least 2 hours after Crestor.  Pt verbalized understanding.

## 2018-02-25 ENCOUNTER — Telehealth: Payer: Self-pay | Admitting: *Deleted

## 2018-02-25 NOTE — Telephone Encounter (Signed)
Called pt re: test results. Left a message for her to call back

## 2018-02-25 NOTE — Telephone Encounter (Signed)
Returned pts call and she has been made aware of her Vas Carotid US.

## 2018-02-25 NOTE — Telephone Encounter (Signed)
New Message ° ° ° °Patient returning phone call  °

## 2018-03-05 ENCOUNTER — Ambulatory Visit: Payer: Medicare Other

## 2018-03-05 DIAGNOSIS — I482 Chronic atrial fibrillation, unspecified: Secondary | ICD-10-CM | POA: Diagnosis not present

## 2018-03-05 DIAGNOSIS — Z5181 Encounter for therapeutic drug level monitoring: Secondary | ICD-10-CM | POA: Diagnosis not present

## 2018-03-05 LAB — POCT INR: INR: 1.9 — AB (ref 2.0–3.0)

## 2018-03-05 NOTE — Patient Instructions (Signed)
PLEASE START NEW DOSAGE OF 1 tablet every day except 1/2 tablet on Mondays, Wednesdays, and Fridays.   Recheck in 4 weeks.  Call if placed on any new medications [phone].

## 2018-03-25 ENCOUNTER — Other Ambulatory Visit: Payer: Medicare Other | Admitting: *Deleted

## 2018-03-25 DIAGNOSIS — E7800 Pure hypercholesterolemia, unspecified: Secondary | ICD-10-CM

## 2018-03-25 LAB — HEPATIC FUNCTION PANEL
ALT: 7 IU/L (ref 0–32)
AST: 12 IU/L (ref 0–40)
Albumin: 3.7 g/dL (ref 3.7–4.7)
Alkaline Phosphatase: 92 IU/L (ref 39–117)
BILIRUBIN, DIRECT: 0.16 mg/dL (ref 0.00–0.40)
Bilirubin Total: 0.5 mg/dL (ref 0.0–1.2)
Total Protein: 6.4 g/dL (ref 6.0–8.5)

## 2018-03-25 LAB — LIPID PANEL
CHOL/HDL RATIO: 3.3 ratio (ref 0.0–4.4)
Cholesterol, Total: 133 mg/dL (ref 100–199)
HDL: 40 mg/dL (ref >39)
LDL Calculated: 79 mg/dL (ref 0–99)
Triglycerides: 69 mg/dL (ref 0–149)
VLDL Cholesterol Cal: 14 mg/dL (ref 5–40)

## 2018-03-27 ENCOUNTER — Telehealth: Payer: Self-pay

## 2018-03-27 NOTE — Telephone Encounter (Signed)
LMTCB

## 2018-03-27 NOTE — Telephone Encounter (Signed)
-----   Message from Belva Crome, MD sent at 03/27/2018  9:37 AM EST ----- Let the patient know the liver and lipids are quite nice on current therapy.  Any side effects to rosuvastatin? A copy will be sent to Seward Carol, MD

## 2018-03-30 ENCOUNTER — Other Ambulatory Visit: Payer: Self-pay | Admitting: Interventional Cardiology

## 2018-04-02 ENCOUNTER — Ambulatory Visit: Payer: Medicare Other | Admitting: *Deleted

## 2018-04-02 DIAGNOSIS — I482 Chronic atrial fibrillation, unspecified: Secondary | ICD-10-CM

## 2018-04-02 DIAGNOSIS — Z5181 Encounter for therapeutic drug level monitoring: Secondary | ICD-10-CM

## 2018-04-02 LAB — POCT INR: INR: 1.7 — AB (ref 2.0–3.0)

## 2018-04-02 NOTE — Patient Instructions (Addendum)
Description   Today take 1.5 tablets then start taking 1 tablet every day except 1/2 tablet on Mondays and Fridays. Recheck in 3 weeks. Continue eating leafy veggies 3 times a week. Call if placed on any new medications [phone].

## 2018-04-23 ENCOUNTER — Ambulatory Visit: Payer: Medicare Other

## 2018-04-23 DIAGNOSIS — I482 Chronic atrial fibrillation, unspecified: Secondary | ICD-10-CM

## 2018-04-23 DIAGNOSIS — Z5181 Encounter for therapeutic drug level monitoring: Secondary | ICD-10-CM

## 2018-04-23 LAB — POCT INR: INR: 4.1 — AB (ref 2.0–3.0)

## 2018-04-23 NOTE — Patient Instructions (Signed)
Description   Skip today's dosage of Coumadin, then start taking 1 tablet every day except 1/2 tablet on Mondays, Wednesdays and Fridays. Recheck in 2 weeks. Continue eating leafy veggies 3 times a week. Call if placed on any new medications [phone].

## 2018-05-06 ENCOUNTER — Telehealth: Payer: Self-pay | Admitting: Interventional Cardiology

## 2018-05-06 DIAGNOSIS — I5032 Chronic diastolic (congestive) heart failure: Secondary | ICD-10-CM

## 2018-05-06 DIAGNOSIS — I10 Essential (primary) hypertension: Secondary | ICD-10-CM

## 2018-05-06 NOTE — Telephone Encounter (Signed)
It is okay to reorder meds so that they are equal in duration every month or every 3 months.

## 2018-05-06 NOTE — Telephone Encounter (Signed)
New Message:   Pt said she needs to talk a nurse about her medicine asap. She said it was personal and she wanted to discuss it with the nurse or Dr Tamala Julian.

## 2018-05-06 NOTE — Telephone Encounter (Signed)
Spoke with the patient, she wants to have all of her medications reordered so she can get her medication all in one trip, she stated she has some medication that need refills and some that do not. She wants everything sent to [facility] at [address]. Sending to Dr. Tamala Julian.

## 2018-05-07 MED ORDER — WARFARIN SODIUM 5 MG PO TABS: ORAL_TABLET | ORAL | 1 refills | Status: DC

## 2018-05-07 MED ORDER — FUROSEMIDE 40 MG PO TABS: 40.0000 mg | ORAL_TABLET | Freq: Every day | ORAL | 3 refills | Status: DC

## 2018-05-07 MED ORDER — ALBUTEROL SULFATE HFA 108 (90 BASE) MCG/ACT IN AERS: 2.0000 | INHALATION_SPRAY | RESPIRATORY_TRACT | 3 refills | Status: DC | PRN

## 2018-05-07 MED ORDER — LOSARTAN POTASSIUM 100 MG PO TABS: ORAL_TABLET | ORAL | 3 refills | Status: DC

## 2018-05-07 NOTE — Telephone Encounter (Signed)
Called pt and reordered necessary medications. Told pt to call back with any other issues. Pt verbalized understanding.

## 2018-05-24 ENCOUNTER — Telehealth: Payer: Self-pay

## 2018-05-24 NOTE — Telephone Encounter (Signed)
lmom for prescreen/drive thru 

## 2018-05-27 ENCOUNTER — Ambulatory Visit (INDEPENDENT_AMBULATORY_CARE_PROVIDER_SITE_OTHER): Payer: Medicare Other | Admitting: Pharmacist

## 2018-05-27 ENCOUNTER — Other Ambulatory Visit: Payer: Self-pay

## 2018-05-27 DIAGNOSIS — Z5181 Encounter for therapeutic drug level monitoring: Secondary | ICD-10-CM

## 2018-05-27 DIAGNOSIS — I482 Chronic atrial fibrillation, unspecified: Secondary | ICD-10-CM | POA: Diagnosis not present

## 2018-05-27 LAB — POCT INR: INR: 2.6 (ref 2.0–3.0)

## 2018-06-21 ENCOUNTER — Telehealth: Payer: Self-pay

## 2018-06-21 NOTE — Telephone Encounter (Signed)
lmom for prescreen  

## 2018-06-21 NOTE — Telephone Encounter (Signed)

## 2018-06-24 ENCOUNTER — Ambulatory Visit (INDEPENDENT_AMBULATORY_CARE_PROVIDER_SITE_OTHER): Payer: Medicare Other | Admitting: Pharmacist

## 2018-06-24 ENCOUNTER — Other Ambulatory Visit: Payer: Self-pay

## 2018-06-24 DIAGNOSIS — I482 Chronic atrial fibrillation, unspecified: Secondary | ICD-10-CM | POA: Diagnosis not present

## 2018-06-24 DIAGNOSIS — Z5181 Encounter for therapeutic drug level monitoring: Secondary | ICD-10-CM

## 2018-06-24 LAB — POCT INR: INR: 3.6 — AB (ref 2.0–3.0)

## 2018-07-09 ENCOUNTER — Telehealth: Payer: Self-pay

## 2018-07-09 NOTE — Telephone Encounter (Signed)

## 2018-07-09 NOTE — Telephone Encounter (Signed)
lmom for prescreen  

## 2018-07-10 ENCOUNTER — Ambulatory Visit (INDEPENDENT_AMBULATORY_CARE_PROVIDER_SITE_OTHER): Payer: Medicare Other | Admitting: Pharmacist

## 2018-07-10 ENCOUNTER — Other Ambulatory Visit: Payer: Self-pay

## 2018-07-10 DIAGNOSIS — Z5181 Encounter for therapeutic drug level monitoring: Secondary | ICD-10-CM | POA: Diagnosis not present

## 2018-07-10 DIAGNOSIS — I482 Chronic atrial fibrillation, unspecified: Secondary | ICD-10-CM | POA: Diagnosis not present

## 2018-07-10 LAB — POCT INR: INR: 3 (ref 2.0–3.0)

## 2018-07-24 ENCOUNTER — Telehealth: Payer: Self-pay

## 2018-07-24 NOTE — Telephone Encounter (Signed)
lmom for prescreen  

## 2018-07-26 ENCOUNTER — Telehealth: Payer: Self-pay | Admitting: *Deleted

## 2018-07-26 NOTE — Telephone Encounter (Signed)

## 2018-08-06 ENCOUNTER — Ambulatory Visit (INDEPENDENT_AMBULATORY_CARE_PROVIDER_SITE_OTHER): Payer: Medicare Other | Admitting: Pharmacist

## 2018-08-06 ENCOUNTER — Other Ambulatory Visit: Payer: Self-pay

## 2018-08-06 DIAGNOSIS — I482 Chronic atrial fibrillation, unspecified: Secondary | ICD-10-CM | POA: Diagnosis not present

## 2018-08-06 DIAGNOSIS — Z5181 Encounter for therapeutic drug level monitoring: Secondary | ICD-10-CM | POA: Diagnosis not present

## 2018-08-06 LAB — POCT INR: INR: 2.6 (ref 2.0–3.0)

## 2018-08-06 MED ORDER — APIXABAN 5 MG PO TABS: 5.0000 mg | ORAL_TABLET | Freq: Two times a day (BID) | ORAL | 1 refills | Status: DC

## 2018-08-06 MED ORDER — APIXABAN 5 MG PO TABS: 5.0000 mg | ORAL_TABLET | Freq: Two times a day (BID) | ORAL | 0 refills | Status: DC

## 2018-08-06 NOTE — Patient Instructions (Signed)
Description   Stop taking warfarin. Start taking Eliquis 5mg  twice daily - first dose is Wednesday evening, then start with twice daily dosing (morning and evening) on Thursday.

## 2018-08-28 ENCOUNTER — Other Ambulatory Visit: Payer: Self-pay | Admitting: Pharmacist

## 2018-08-28 MED ORDER — WARFARIN SODIUM 5 MG PO TABS: ORAL_TABLET | ORAL | 3 refills | Status: DC

## 2018-08-28 NOTE — Telephone Encounter (Signed)
Patient called the clinic stating that she doesn't want to take the Eliquis anymore. States that she is having some bleeding from vaginal area or "down there" that started when she started the Eliqiuis. I encouraged patient to follow up with her gynecologist about this. She stated she has a appointment with her soon. I again encouraged her to discuss this with her doctor. Advised patient to overlap warfarin with Eliquis for 2 days.   Tonight take 1 tablet tablet of warfarin (5mg ) along with your Eliquis Tomorrow take your Eliquis twice a day and take 1 tablet of warfarin at night.  Then stop taking Eliquis Continue taking warfarin 1 tablet daily except 0.5 tablets on MWF. Check INR on Monday 7/20 @ 10:30

## 2018-08-28 NOTE — Telephone Encounter (Signed)
Noted. Not sure changing will make a difference.

## 2018-08-29 ENCOUNTER — Telehealth: Payer: Self-pay

## 2018-08-29 NOTE — Telephone Encounter (Addendum)
I did attempt to explain to patient that she may want to make sure there isn't something else going on before switching blood thinners, that this may not be just from Eliquis in particular, but patient was adamant that she did not want to stay on Eliquis

## 2018-08-29 NOTE — Telephone Encounter (Signed)
lmom for prescreen  

## 2018-08-29 NOTE — Telephone Encounter (Signed)

## 2018-09-02 ENCOUNTER — Ambulatory Visit (INDEPENDENT_AMBULATORY_CARE_PROVIDER_SITE_OTHER): Payer: Medicare Other | Admitting: Pharmacist

## 2018-09-02 ENCOUNTER — Other Ambulatory Visit: Payer: Self-pay

## 2018-09-02 DIAGNOSIS — I482 Chronic atrial fibrillation, unspecified: Secondary | ICD-10-CM | POA: Diagnosis not present

## 2018-09-02 DIAGNOSIS — Z5181 Encounter for therapeutic drug level monitoring: Secondary | ICD-10-CM

## 2018-09-02 LAB — CBC
Hematocrit: 41.2 % (ref 34.0–46.6)
Hemoglobin: 13.7 g/dL (ref 11.1–15.9)
MCH: 30.2 pg (ref 26.6–33.0)
MCHC: 33.3 g/dL (ref 31.5–35.7)
MCV: 91 fL (ref 79–97)
Platelets: 185 10*3/uL (ref 150–450)
RBC: 4.54 x10E6/uL (ref 3.77–5.28)
RDW: 12.3 % (ref 11.7–15.4)
WBC: 9.7 10*3/uL (ref 3.4–10.8)

## 2018-09-02 LAB — BASIC METABOLIC PANEL
BUN/Creatinine Ratio: 13 (ref 12–28)
BUN: 20 mg/dL (ref 8–27)
CO2: 22 mmol/L (ref 20–29)
Calcium: 10.3 mg/dL (ref 8.7–10.3)
Chloride: 105 mmol/L (ref 96–106)
Creatinine, Ser: 1.59 mg/dL — ABNORMAL HIGH (ref 0.57–1.00)
GFR calc Af Amer: 37 mL/min/{1.73_m2} — ABNORMAL LOW (ref >59)
GFR calc non Af Amer: 32 mL/min/{1.73_m2} — ABNORMAL LOW (ref >59)
Glucose: 108 mg/dL — ABNORMAL HIGH (ref 65–99)
Potassium: 4.2 mmol/L (ref 3.5–5.2)
Sodium: 140 mmol/L (ref 134–144)

## 2018-09-02 LAB — POCT INR: INR: 1.8 — AB (ref 2.0–3.0)

## 2018-09-02 NOTE — Patient Instructions (Addendum)
  Description   Take 1 tablet today, then continue taking 1 tablet every day except 1/2 tablet on Mondays, Wednesdays and Fridays. Recheck INR in 3 weeks.

## 2018-09-04 ENCOUNTER — Telehealth: Payer: Self-pay | Admitting: Interventional Cardiology

## 2018-09-04 DIAGNOSIS — I5032 Chronic diastolic (congestive) heart failure: Secondary | ICD-10-CM

## 2018-09-04 MED ORDER — SPIRONOLACTONE 25 MG PO TABS: ORAL_TABLET | ORAL | 2 refills | Status: DC

## 2018-09-04 NOTE — Telephone Encounter (Signed)
New Message   Patient is returning call in reference to lab results. Please call to discuss.  

## 2018-09-04 NOTE — Telephone Encounter (Signed)
Left message to call back  

## 2018-09-04 NOTE — Telephone Encounter (Signed)
Spoke with pt and went over lab results.  Pt agreeable to come for labs on 8/24.  Pt verbalized understanding.

## 2018-09-24 ENCOUNTER — Encounter (INDEPENDENT_AMBULATORY_CARE_PROVIDER_SITE_OTHER): Payer: Self-pay

## 2018-09-24 ENCOUNTER — Other Ambulatory Visit: Payer: Self-pay

## 2018-09-24 ENCOUNTER — Ambulatory Visit (INDEPENDENT_AMBULATORY_CARE_PROVIDER_SITE_OTHER): Payer: Medicare Other | Admitting: Pharmacist

## 2018-09-24 ENCOUNTER — Telehealth: Payer: Self-pay | Admitting: *Deleted

## 2018-09-24 DIAGNOSIS — I482 Chronic atrial fibrillation, unspecified: Secondary | ICD-10-CM

## 2018-09-24 DIAGNOSIS — Z5181 Encounter for therapeutic drug level monitoring: Secondary | ICD-10-CM | POA: Diagnosis not present

## 2018-09-24 LAB — POCT INR: INR: 3.9 — AB (ref 2.0–3.0)

## 2018-09-24 NOTE — Patient Instructions (Signed)
Description   Skip your Coumadin today, then continue taking 1 tablet every day except 1/2 tablet on Mondays, Wednesdays and Fridays. You will need to hold your Coumadin for 5 days prior to your biopsy. Recheck INR in 3 weeks - call clinic with any concerns [phone].

## 2018-09-24 NOTE — Telephone Encounter (Signed)
Left VM

## 2018-09-24 NOTE — Telephone Encounter (Signed)
   Primary Cardiologist: Sinclair Grooms, MD  Chart reviewed as part of pre-operative protocol coverage. Patient was contacted 09/24/2018 in reference to pre-operative risk assessment for pending surgery as outlined below.  Kathy Montgomery was last seen on 02/15/18 by Dr. Tamala Julian.  Since that day, Kathy Montgomery has done well.  She can complete at least 4.0 METS without anginal symptoms.   Per our clinical pharmacist: Pt takes warfarin for afib with CHADS2VASc score of 5 (age, sex, CHF, HTN, CAD). Ok to hold warfarin for 5 days prior to biopsy. Pt was seen for INR check today - she will call clinic when procedure date is scheduled.   Therefore, based on ACC/AHA guidelines, the patient would be at acceptable risk for the planned procedure without further cardiovascular testing.   I will route this recommendation to the requesting party via Epic fax function and remove from pre-op pool.  Please call with questions.  Tami Lin Duke, PA 09/24/2018, 3:17 PM

## 2018-09-24 NOTE — Telephone Encounter (Signed)
   [facility] Pre-operative Risk Assessment    Request for surgical clearance:  1. What type of surgery is being performed? ENDOMETRIAL BX    2. When is this surgery scheduled? TBD   3. What type of clearance is required (medical clearance vs. Pharmacy clearance to hold med vs. Both)? BOTH  4. Are there any medications that need to be held prior to surgery and how long? WARFARIN    5. Practice name and name of physician performing surgery? [facility]; DR. TARA COLE   6. What is your office phone number [phone]    7.   What is your office fax number [phone]  8.   Anesthesia type (None, local, MAC, general) ? NOT LISTED   Kathy Montgomery 09/24/2018, 11:00 AM  _________________________________________________________________   (provider comments below)

## 2018-09-24 NOTE — Telephone Encounter (Signed)
Pt takes warfarin for afib with CHADS2VASc score of 5 (age, sex, CHF, HTN, CAD). Ok to hold warfarin for 5 days prior to biopsy. Pt was seen for INR check today - she will call clinic when procedure date is scheduled.

## 2018-09-30 ENCOUNTER — Telehealth: Payer: Self-pay | Admitting: Interventional Cardiology

## 2018-09-30 NOTE — Telephone Encounter (Signed)
Returned pt's call about surgical clearance and LMOM for pt to call our office back.

## 2018-09-30 NOTE — Telephone Encounter (Signed)
Encounter not needed

## 2018-09-30 NOTE — Telephone Encounter (Signed)
New message:     Patient calling concering a medical clearance. Please call patient.

## 2018-10-01 NOTE — Telephone Encounter (Signed)
Left message for patient to contact office about her cardiac clearance.

## 2018-10-03 NOTE — Telephone Encounter (Signed)
Returned pt's call and she just wanted to be sure that the [facility]madin Clinic was aware that she was having a BX and they are asking her to come off her Coumadin for a period of time. Pt has been made aware that they have already been made aware and have already sent the clearance back to [facility].  Pt thanked me for the return call.

## 2018-10-04 ENCOUNTER — Other Ambulatory Visit: Payer: Self-pay | Admitting: Obstetrics and Gynecology

## 2018-10-08 ENCOUNTER — Other Ambulatory Visit: Payer: Self-pay

## 2018-10-08 ENCOUNTER — Other Ambulatory Visit: Payer: Medicare Other

## 2018-10-08 ENCOUNTER — Ambulatory Visit (INDEPENDENT_AMBULATORY_CARE_PROVIDER_SITE_OTHER): Payer: Medicare Other | Admitting: *Deleted

## 2018-10-08 DIAGNOSIS — Z5181 Encounter for therapeutic drug level monitoring: Secondary | ICD-10-CM

## 2018-10-08 DIAGNOSIS — I482 Chronic atrial fibrillation, unspecified: Secondary | ICD-10-CM

## 2018-10-08 DIAGNOSIS — I5032 Chronic diastolic (congestive) heart failure: Secondary | ICD-10-CM

## 2018-10-08 LAB — POCT INR: INR: 1.1 — AB (ref 2.0–3.0)

## 2018-10-08 LAB — BASIC METABOLIC PANEL
BUN/Creatinine Ratio: 13 (ref 12–28)
BUN: 18 mg/dL (ref 8–27)
CO2: 20 mmol/L (ref 20–29)
Calcium: 10.1 mg/dL (ref 8.7–10.3)
Chloride: 106 mmol/L (ref 96–106)
Creatinine, Ser: 1.34 mg/dL — ABNORMAL HIGH (ref 0.57–1.00)
GFR calc Af Amer: 46 mL/min/{1.73_m2} — ABNORMAL LOW (ref >59)
GFR calc non Af Amer: 40 mL/min/{1.73_m2} — ABNORMAL LOW (ref >59)
Glucose: 117 mg/dL — ABNORMAL HIGH (ref 65–99)
Potassium: 4.4 mmol/L (ref 3.5–5.2)
Sodium: 139 mmol/L (ref 134–144)

## 2018-10-08 NOTE — Patient Instructions (Addendum)
Description   Tomorrow take 1 tablet (post biopsy 10/07/2018) then continue taking 1 tablet every day except 1/2 tablet on Mondays, Wednesdays and Fridays. Recheck INR in 10 days. Call clinic with any concerns [phone].

## 2018-10-18 ENCOUNTER — Other Ambulatory Visit: Payer: Self-pay

## 2018-10-18 ENCOUNTER — Ambulatory Visit (INDEPENDENT_AMBULATORY_CARE_PROVIDER_SITE_OTHER): Payer: Medicare Other | Admitting: *Deleted

## 2018-10-18 DIAGNOSIS — Z5181 Encounter for therapeutic drug level monitoring: Secondary | ICD-10-CM | POA: Diagnosis not present

## 2018-10-18 DIAGNOSIS — I482 Chronic atrial fibrillation, unspecified: Secondary | ICD-10-CM

## 2018-10-18 LAB — POCT INR: INR: 2.3 (ref 2.0–3.0)

## 2018-10-18 NOTE — Patient Instructions (Signed)
Description   Continue taking 1 tablet every day except 1/2 tablet on Mondays, Wednesdays and Fridays. Recheck INR in 3 weeks. Call clinic with any concerns [phone].

## 2018-10-30 ENCOUNTER — Other Ambulatory Visit: Payer: Self-pay | Admitting: Interventional Cardiology

## 2018-10-30 MED ORDER — DILTIAZEM HCL ER COATED BEADS 120 MG PO CP24: ORAL_CAPSULE | ORAL | 1 refills | Status: DC

## 2018-11-08 ENCOUNTER — Other Ambulatory Visit: Payer: Self-pay

## 2018-11-08 ENCOUNTER — Ambulatory Visit (INDEPENDENT_AMBULATORY_CARE_PROVIDER_SITE_OTHER): Payer: Medicare Other | Admitting: *Deleted

## 2018-11-08 ENCOUNTER — Encounter (INDEPENDENT_AMBULATORY_CARE_PROVIDER_SITE_OTHER): Payer: Self-pay

## 2018-11-08 DIAGNOSIS — Z5181 Encounter for therapeutic drug level monitoring: Secondary | ICD-10-CM

## 2018-11-08 DIAGNOSIS — I482 Chronic atrial fibrillation, unspecified: Secondary | ICD-10-CM

## 2018-11-08 LAB — POCT INR: INR: 3.3 — AB (ref 2.0–3.0)

## 2018-11-08 NOTE — Patient Instructions (Signed)
Description   Do not take any Coumadin today then continue taking 1 tablet every day except 1/2 tablet on Mondays, Wednesdays and Fridays. Recheck INR in 3 weeks. Call clinic with any concerns [phone].

## 2018-11-29 ENCOUNTER — Ambulatory Visit: Payer: Medicare Other | Admitting: *Deleted

## 2018-11-29 ENCOUNTER — Other Ambulatory Visit: Payer: Self-pay

## 2018-11-29 DIAGNOSIS — Z5181 Encounter for therapeutic drug level monitoring: Secondary | ICD-10-CM | POA: Diagnosis not present

## 2018-11-29 DIAGNOSIS — I482 Chronic atrial fibrillation, unspecified: Secondary | ICD-10-CM | POA: Diagnosis not present

## 2018-11-29 LAB — POCT INR: INR: 2.5 (ref 2.0–3.0)

## 2018-11-29 NOTE — Patient Instructions (Addendum)
Description   Continue taking 1 tablet every day except 1/2 tablet on Mondays, Wednesdays and Fridays. Recheck INR in 4 weeks. Call clinic with any concerns [phone].

## 2018-12-11 ENCOUNTER — Other Ambulatory Visit: Payer: Self-pay | Admitting: Obstetrics and Gynecology

## 2018-12-11 DIAGNOSIS — Z1231 Encounter for screening mammogram for malignant neoplasm of breast: Secondary | ICD-10-CM

## 2018-12-26 ENCOUNTER — Ambulatory Visit (INDEPENDENT_AMBULATORY_CARE_PROVIDER_SITE_OTHER): Payer: Medicare Other | Admitting: *Deleted

## 2018-12-26 ENCOUNTER — Encounter (INDEPENDENT_AMBULATORY_CARE_PROVIDER_SITE_OTHER): Payer: Self-pay

## 2018-12-26 ENCOUNTER — Other Ambulatory Visit: Payer: Self-pay

## 2018-12-26 DIAGNOSIS — I482 Chronic atrial fibrillation, unspecified: Secondary | ICD-10-CM | POA: Diagnosis not present

## 2018-12-26 DIAGNOSIS — Z5181 Encounter for therapeutic drug level monitoring: Secondary | ICD-10-CM | POA: Diagnosis not present

## 2018-12-26 LAB — POCT INR: INR: 2.4 (ref 2.0–3.0)

## 2018-12-26 NOTE — Patient Instructions (Signed)
Description   Continue taking 1 tablet every day except 1/2 tablet on Mondays, Wednesdays and Fridays. Recheck INR in 5 weeks. Call clinic with any concerns [phone].

## 2019-01-14 ENCOUNTER — Other Ambulatory Visit: Payer: Self-pay | Admitting: Cardiology

## 2019-01-14 DIAGNOSIS — Z20822 Contact with and (suspected) exposure to covid-19: Secondary | ICD-10-CM

## 2019-01-16 ENCOUNTER — Other Ambulatory Visit: Payer: Self-pay | Admitting: Interventional Cardiology

## 2019-01-16 LAB — NOVEL CORONAVIRUS, NAA: SARS-CoV-2, NAA: NOT DETECTED

## 2019-01-16 NOTE — Telephone Encounter (Signed)
Will need to get from PCP

## 2019-01-16 NOTE — Telephone Encounter (Signed)
Pt's pharmacy is requesting a refill on albuterol. Would Dr. Tamala Julian like to refill this inhaler? Please address

## 2019-01-20 ENCOUNTER — Other Ambulatory Visit: Payer: Self-pay | Admitting: Interventional Cardiology

## 2019-01-27 ENCOUNTER — Other Ambulatory Visit: Payer: Self-pay

## 2019-01-27 MED ORDER — ALBUTEROL SULFATE HFA 108 (90 BASE) MCG/ACT IN AERS: 2.0000 | INHALATION_SPRAY | RESPIRATORY_TRACT | 0 refills | Status: AC | PRN

## 2019-01-30 ENCOUNTER — Other Ambulatory Visit: Payer: Self-pay

## 2019-01-30 ENCOUNTER — Ambulatory Visit: Payer: Medicare Other | Admitting: *Deleted

## 2019-01-30 DIAGNOSIS — Z5181 Encounter for therapeutic drug level monitoring: Secondary | ICD-10-CM

## 2019-01-30 DIAGNOSIS — I482 Chronic atrial fibrillation, unspecified: Secondary | ICD-10-CM

## 2019-01-30 LAB — POCT INR: INR: 3 (ref 2.0–3.0)

## 2019-01-30 NOTE — Patient Instructions (Signed)
Description   Continue taking 1 tablet every day except 1/2 tablet on Mondays, Wednesdays and Fridays. Recheck INR in 6 weeks. Call clinic with any concerns [phone].

## 2019-01-31 ENCOUNTER — Ambulatory Visit
Admission: RE | Admit: 2019-01-31 | Discharge: 2019-01-31 | Disposition: A | Discharge status: Home / Self Care | Payer: Medicare Other | Source: Ambulatory Visit | Attending: Obstetrics and Gynecology | Admitting: Obstetrics and Gynecology

## 2019-01-31 DIAGNOSIS — Z1231 Encounter for screening mammogram for malignant neoplasm of breast: Secondary | ICD-10-CM

## 2019-02-26 ENCOUNTER — Other Ambulatory Visit: Payer: Self-pay | Admitting: Interventional Cardiology

## 2019-02-26 MED ORDER — ROSUVASTATIN CALCIUM 5 MG PO TABS: 5.0000 mg | ORAL_TABLET | Freq: Every day | ORAL | 0 refills | Status: DC

## 2019-03-17 ENCOUNTER — Ambulatory Visit: Payer: Medicare PPO | Attending: Internal Medicine

## 2019-03-17 DIAGNOSIS — Z20822 Contact with and (suspected) exposure to covid-19: Secondary | ICD-10-CM

## 2019-03-17 NOTE — Progress Notes (Signed)
Cardiology Office Note:    Date:  03/18/2019   ID:  Kathy Montgomery, DOB 10/14/1945, MRN [id number]  PCP:  Seward Carol, MD  Cardiologist:  Sinclair Grooms, MD   Referring MD: Seward Carol, MD   Chief Complaint  Patient presents with  . Atrial Fibrillation  . Hypertension    History of Present Illness:    Kathy Montgomery is a 74 y.o. female with a hx of paroxysmal atrial fibrillation,obstructive sleep apnea on CPAPand chronic anticoagulation therapy.  Kathy Montgomery is doing well.  Rare palpitations.  No syncope.  No bleeding on Coumadin.  Risk factors are being managed with diltiazem and losartan for blood pressure, rosuvastatin for cholesterol, furosemide and spironolactone for volume control.  No side effects.  Past Medical History:  Diagnosis Date  . Atrial fibrillation ([place]) 11/14/2012  . Hypercholesteremia   . Hypertension   . Long term (current) use of anticoagulants 12/17/2012   Atrial fibrillation   . MI (myocardial infarction) [facility])     Past Surgical History:  Procedure Laterality Date  . ECTOPIC PREGNANCY SURGERY      Current Medications: Current Meds  Medication Sig  . albuterol (PROAIR HFA) 108 (90 Base) MCG/ACT inhaler Inhale 2 puffs into the lungs every 4 (four) hours as needed for wheezing or shortness of breath.  . diltiazem (CARTIA XT) 120 MG 24 hr capsule TAKE 1 CAPSULE(120 MG) BY MOUTH DAILY.  . furosemide (LASIX) 40 MG tablet Take 1 tablet (40 mg total) by mouth daily.  Marland Kitchen losartan (COZAAR) 100 MG tablet TAKE 1 TABLET(100 MG) BY MOUTH DAILY  . rosuvastatin (CRESTOR) 5 MG tablet Take 1 tablet (5 mg total) by mouth daily.  Marland Kitchen spironolactone (ALDACTONE) 25 MG tablet TAKE 1/2 TABLET(12.5 MG) BY MOUTH DAILY  . warfarin (COUMADIN) 5 MG tablet Take as directed by coumadin clinic  . [DISCONTINUED] diltiazem (CARTIA XT) 120 MG 24 hr capsule TAKE 1 CAPSULE(120 MG) BY MOUTH DAILY. Please make yearly appt with Dr. Tamala Julian for January for future refills. 1st attempt  .  [DISCONTINUED] rosuvastatin (CRESTOR) 5 MG tablet Take 1 tablet (5 mg total) by mouth daily. Please keep upcoming appt with Dr. Tamala Julian in February for future refills. Thank you   Current Facility-Administered Medications for the 03/18/19 encounter (Office Visit) with Belva Crome, MD  Medication  . albuterol (PROVENTIL HFA;VENTOLIN HFA) 108 (90 Base) MCG/ACT inhaler 2 puff     Allergies:   Eliquis [apixaban]   Social History   Socioeconomic History  . Marital status: Single    Spouse name: Not on file  . Number of children: Not on file  . Years of education: Not on file  . Highest education level: Not on file  Occupational History  . Not on file  Tobacco Use  . Smoking status: Former Smoker    Types: Cigarettes    Quit date: 05/17/2000    Years since quitting: 18.8  . Smokeless tobacco: Never Used  Substance and Sexual Activity  . Alcohol use: No  . Drug use: No  . Sexual activity: Never  Other Topics Concern  . Not on file  Social History Narrative  . Not on file   Social Determinants of Health   Financial Resource Strain:   . Difficulty of Paying Living Expenses: Not on file  Food Insecurity:   . Worried About Charity fundraiser in the Last Year: Not on file  . Ran Out of Food in the Last Year: Not on file  Transportation Needs:   . Film/video editor (Medical): Not on file  . Lack of Transportation (Non-Medical): Not on file  Physical Activity:   . Days of Exercise per Week: Not on file  . Minutes of Exercise per Session: Not on file  Stress:   . Feeling of Stress : Not on file  Social Connections:   . Frequency of Communication with Friends and Family: Not on file  . Frequency of Social Gatherings with Friends and Family: Not on file  . Attends Religious Services: Not on file  . Active Member of Clubs or Organizations: Not on file  . Attends Archivist Meetings: Not on file  . Marital Status: Not on file     Family History: The patient's  family history includes Esophageal cancer in her sister; Stroke in her mother.  ROS:   Please see the history of present illness.    Her hands get numb when she lies flat.  Sitting causes the feeling to go away.  She has to sleep propped up with CPAP as her breathing is better in that position.  All other systems reviewed and are negative.  EKGs/Labs/Other Studies Reviewed:    The following studies were reviewed today: No new data  EKG:  EKG vertical axis, biatrial abnormality, normal sinus rhythm.  No change compared to prior.  Recent Labs: 03/25/2018: ALT 7 09/02/2018: Hemoglobin 13.7; Platelets 185 10/08/2018: BUN 18; Creatinine, Ser 1.34; Potassium 4.4; Sodium 139  Recent Lipid Panel    Component Value Date/Time   CHOL 133 03/25/2018 1102   TRIG 69 03/25/2018 1102   HDL 40 03/25/2018 1102   CHOLHDL 3.3 03/25/2018 1102   CHOLHDL 3.5 Ratio 05/09/2007 2043   VLDL 19 05/09/2007 2043   LDLCALC 79 03/25/2018 1102    Physical Exam:    VS:  BP 128/72   Pulse 67   Ht 5\' 3"  (1.6 m)   Wt 215 lb 12.8 oz (97.9 kg)   SpO2 99%   BMI 38.23 kg/m     Wt Readings from Last 3 Encounters:  03/18/19 215 lb 12.8 oz (97.9 kg)  02/15/18 219 lb 3.2 oz (99.4 kg)  12/31/17 221 lb 3.2 oz (100.3 kg)     GEN: Moderate obesity. No acute distress HEENT: Normal NECK: No JVD. LYMPHATICS: No lymphadenopathy CARDIAC:  RRR without murmur, gallop, or edema. VASCULAR:  Normal Pulses. No bruits. RESPIRATORY:  Clear to auscultation without rales, wheezing or rhonchi  ABDOMEN: Soft, non-tender, non-distended, No pulsatile mass, MUSCULOSKELETAL: No deformity  SKIN: Warm and dry NEUROLOGIC:  Alert and oriented x 3 PSYCHIATRIC:  Normal affect   ASSESSMENT:    1. Paroxysmal atrial fibrillation (HCC)   2. Chronic diastolic heart failure ([place])   3. HYPERTENSION, BENIGN SYSTEMIC   4. Chronic anticoagulation   5. Hyperlipidemia with target LDL less than 70   6. Bilateral carotid artery disease,  unspecified type ([place])   7. Educated about COVID-19 virus infection   8. Chronic atrial fibrillation (HCC)    PLAN:    In order of problems listed above:  1. She is in sinus rhythm.  Atrial fibrillation is paroxysmal. 2. No volume overload or dyspnea to suggest heart failure. 3. Excellent control. 4. No bleeding on Coumadin.   5. LDL was 100 in November.  She is on low intensity statin therapy. 6. Carotids are not audible 7. 3W's and Covid vaccine are endorsed by the patient has aspirations.  Encouraged aerobic activity.  Call if shortness of  breath.  Call if bleeding or head trauma.  Clinical follow-up 9 to 12 months.   Medication Adjustments/Labs and Tests Ordered: Current medicines are reviewed at length with the patient today.  Concerns regarding medicines are outlined above.  Orders Placed This Encounter  Procedures  . EKG 12-Lead   Meds ordered this encounter  Medications  . diltiazem (CARTIA XT) 120 MG 24 hr capsule    Sig: TAKE 1 CAPSULE(120 MG) BY MOUTH DAILY.    Dispense:  90 capsule    Refill:  3  . rosuvastatin (CRESTOR) 5 MG tablet    Sig: Take 1 tablet (5 mg total) by mouth daily.    Dispense:  90 tablet    Refill:  3    Patient Instructions  Medication Instructions:  Your physician recommends that you continue on your current medications as directed. Please refer to the Current Medication list given to you today.  *If you need a refill on your cardiac medications before your next appointment, please call your pharmacy*  Lab Work: None If you have labs (blood work) drawn today and your tests are completely normal, you will receive your results only by: Marland Kitchen MyChart Message (if you have MyChart) OR . A paper copy in the mail If you have any lab test that is abnormal or we need to change your treatment, we will call you to review the results.  Testing/Procedures: None  Follow-Up: At [facility], you and your health needs are our priority.  As part of  our continuing mission to provide you with exceptional heart care, we have created designated Provider Care Teams.  These Care Teams include your primary Cardiologist (physician) and Advanced Practice Providers (APPs -  Physician Assistants and Nurse Practitioners) who all work together to provide you with the care you need, when you need it.  Your next appointment:   9-12 month(s)  The format for your next appointment:   In Person  Provider:   You may see Sinclair Grooms, MD or one of the following Advanced Practice Providers on your designated Care Team:    Truitt Merle, NP  Cecilie Kicks, NP  Kathyrn Drown, NP   Other Instructions      Signed, Sinclair Grooms, MD  03/18/2019 10:25 AM    [place]

## 2019-03-18 ENCOUNTER — Ambulatory Visit (INDEPENDENT_AMBULATORY_CARE_PROVIDER_SITE_OTHER): Payer: Medicare PPO | Admitting: *Deleted

## 2019-03-18 ENCOUNTER — Encounter: Payer: Self-pay | Admitting: Interventional Cardiology

## 2019-03-18 ENCOUNTER — Other Ambulatory Visit: Payer: Self-pay

## 2019-03-18 ENCOUNTER — Ambulatory Visit: Payer: Medicare PPO | Admitting: Interventional Cardiology

## 2019-03-18 VITALS — BP 128/72 | HR 67 | Ht 63.0 in | Wt 215.8 lb

## 2019-03-18 DIAGNOSIS — E785 Hyperlipidemia, unspecified: Secondary | ICD-10-CM | POA: Diagnosis not present

## 2019-03-18 DIAGNOSIS — I5032 Chronic diastolic (congestive) heart failure: Secondary | ICD-10-CM

## 2019-03-18 DIAGNOSIS — I482 Chronic atrial fibrillation, unspecified: Secondary | ICD-10-CM | POA: Diagnosis not present

## 2019-03-18 DIAGNOSIS — I10 Essential (primary) hypertension: Secondary | ICD-10-CM

## 2019-03-18 DIAGNOSIS — I110 Hypertensive heart disease with heart failure: Secondary | ICD-10-CM | POA: Diagnosis not present

## 2019-03-18 DIAGNOSIS — I509 Heart failure, unspecified: Secondary | ICD-10-CM | POA: Diagnosis not present

## 2019-03-18 DIAGNOSIS — Z7189 Other specified counseling: Secondary | ICD-10-CM

## 2019-03-18 DIAGNOSIS — Z5181 Encounter for therapeutic drug level monitoring: Secondary | ICD-10-CM

## 2019-03-18 DIAGNOSIS — Z7901 Long term (current) use of anticoagulants: Secondary | ICD-10-CM

## 2019-03-18 DIAGNOSIS — I779 Disorder of arteries and arterioles, unspecified: Secondary | ICD-10-CM

## 2019-03-18 DIAGNOSIS — I480 Paroxysmal atrial fibrillation: Secondary | ICD-10-CM

## 2019-03-18 LAB — POCT INR: INR: 2.5 (ref 2.0–3.0)

## 2019-03-18 LAB — NOVEL CORONAVIRUS, NAA: SARS-CoV-2, NAA: NOT DETECTED

## 2019-03-18 MED ORDER — ROSUVASTATIN CALCIUM 5 MG PO TABS: 5.0000 mg | ORAL_TABLET | Freq: Every day | ORAL | 3 refills | Status: DC

## 2019-03-18 MED ORDER — DILTIAZEM HCL ER COATED BEADS 120 MG PO CP24: ORAL_CAPSULE | ORAL | 3 refills | Status: AC

## 2019-03-18 NOTE — Patient Instructions (Signed)
Description   Continue taking 1 tablet every day except 1/2 tablet on Mondays, Wednesdays and Fridays. Recheck INR in 8 weeks. Call clinic with any concerns [phone].

## 2019-03-18 NOTE — Patient Instructions (Signed)
Medication Instructions:  Your physician recommends that you continue on your current medications as directed. Please refer to the Current Medication list given to you today.  *If you need a refill on your cardiac medications before your next appointment, please call your pharmacy*  Lab Work: None If you have labs (blood work) drawn today and your tests are completely normal, you will receive your results only by: . MyChart Message (if you have MyChart) OR . A paper copy in the mail If you have any lab test that is abnormal or we need to change your treatment, we will call you to review the results.  Testing/Procedures: None  Follow-Up: At [facility], you and your health needs are our priority.  As part of our continuing mission to provide you with exceptional heart care, we have created designated Provider Care Teams.  These Care Teams include your primary Cardiologist (physician) and Advanced Practice Providers (APPs -  Physician Assistants and Nurse Practitioners) who all work together to provide you with the care you need, when you need it.  Your next appointment:   9-12 month(s)  The format for your next appointment:   In Person  Provider:   You may see Henry W Smith III, MD or one of the following Advanced Practice Providers on your designated Care Team:    Lori Gerhardt, NP  Laura Ingold, NP  Jill McDaniel, NP   Other Instructions   

## 2019-03-30 ENCOUNTER — Ambulatory Visit: Payer: Medicare PPO | Attending: Internal Medicine

## 2019-03-30 DIAGNOSIS — Z23 Encounter for immunization: Secondary | ICD-10-CM | POA: Insufficient documentation

## 2019-03-30 NOTE — Progress Notes (Signed)
   Covid-19 Vaccination Clinic  Name:  Kathy Montgomery    MRN: [id number] DOB: 19-Apr-1945  03/30/2019  Kathy Montgomery was observed post Covid-19 immunization for 15 minutes without incidence. She was provided with Vaccine Information Sheet and instruction to access the V-Safe system.   Kathy Montgomery was instructed to call 911 with any severe reactions post vaccine: Marland Kitchen Difficulty breathing  . Swelling of your face and throat  . A fast heartbeat  . A bad rash all over your body  . Dizziness and weakness    Immunizations Administered    Name Date Dose VIS Date Route   Pfizer COVID-19 Vaccine 03/30/2019 12:07 PM 0.3 mL 01/24/2019 Intramuscular   Manufacturer: [place]   Lot: [id number]   [place]: [id number]

## 2019-04-18 ENCOUNTER — Other Ambulatory Visit: Payer: Self-pay | Admitting: *Deleted

## 2019-04-18 MED ORDER — WARFARIN SODIUM 5 MG PO TABS: ORAL_TABLET | ORAL | 3 refills | Status: AC

## 2019-04-22 ENCOUNTER — Ambulatory Visit: Payer: Medicare PPO | Attending: Internal Medicine

## 2019-04-22 DIAGNOSIS — Z23 Encounter for immunization: Secondary | ICD-10-CM | POA: Insufficient documentation

## 2019-04-22 NOTE — Progress Notes (Signed)
   Covid-19 Vaccination Clinic  Name:  Kathy Montgomery    MRN: [id number] DOB: 1945/12/08  04/22/2019  [name] was observed post Covid-19 immunization for 15 minutes without incident. She was provided with Vaccine Information Sheet and instruction to access the V-Safe system.   [name] was instructed to call 911 with any severe reactions post vaccine: Marland Kitchen Difficulty breathing  . Swelling of face and throat  . A fast heartbeat  . A bad rash all over body  . Dizziness and weakness   Immunizations Administered    Name Date Dose VIS Date Route   Pfizer COVID-19 Vaccine 04/22/2019  4:44 PM 0.3 mL 01/24/2019 Intramuscular   Manufacturer: [place]   Lot: [id number]   [place]: [id number]

## 2019-05-13 ENCOUNTER — Other Ambulatory Visit: Payer: Self-pay

## 2019-05-13 ENCOUNTER — Ambulatory Visit (INDEPENDENT_AMBULATORY_CARE_PROVIDER_SITE_OTHER): Payer: Medicare PPO | Admitting: Pharmacist

## 2019-05-13 DIAGNOSIS — I482 Chronic atrial fibrillation, unspecified: Secondary | ICD-10-CM

## 2019-05-13 DIAGNOSIS — Z5181 Encounter for therapeutic drug level monitoring: Secondary | ICD-10-CM | POA: Diagnosis not present

## 2019-05-13 LAB — POCT INR: INR: 4.1 — AB (ref 2.0–3.0)

## 2019-05-13 NOTE — Telephone Encounter (Signed)
Needs to come from PCP

## 2019-05-13 NOTE — Telephone Encounter (Signed)
[facility] pharmacy is requesting a refill on Proair inhaler. Would Dr. Tamala Julian like to refill this medication? please address

## 2019-05-14 ENCOUNTER — Other Ambulatory Visit: Payer: Self-pay | Admitting: Nephrology

## 2019-05-14 DIAGNOSIS — I4891 Unspecified atrial fibrillation: Secondary | ICD-10-CM

## 2019-05-14 DIAGNOSIS — R319 Hematuria, unspecified: Secondary | ICD-10-CM

## 2019-05-14 DIAGNOSIS — I129 Hypertensive chronic kidney disease with stage 1 through stage 4 chronic kidney disease, or unspecified chronic kidney disease: Secondary | ICD-10-CM

## 2019-05-14 DIAGNOSIS — N1832 Chronic kidney disease, stage 3b: Secondary | ICD-10-CM

## 2019-05-19 ENCOUNTER — Other Ambulatory Visit: Payer: Self-pay

## 2019-05-19 NOTE — Telephone Encounter (Signed)
See refill request from 3/30.  Needs to come from PCP.

## 2019-05-19 NOTE — Telephone Encounter (Signed)
Pt's pharmacy is requesting a refill on Albuterol. Would Dr. Tamala Julian like to refill this medication? Please address

## 2019-05-21 ENCOUNTER — Other Ambulatory Visit: Payer: Medicare PPO

## 2019-05-22 ENCOUNTER — Ambulatory Visit
Admission: RE | Admit: 2019-05-22 | Discharge: 2019-05-22 | Disposition: A | Discharge status: Home / Self Care | Payer: Medicare PPO | Source: Ambulatory Visit | Attending: Nephrology | Admitting: Nephrology

## 2019-05-22 ENCOUNTER — Other Ambulatory Visit: Payer: Medicare PPO

## 2019-05-22 DIAGNOSIS — R319 Hematuria, unspecified: Secondary | ICD-10-CM

## 2019-05-22 DIAGNOSIS — I129 Hypertensive chronic kidney disease with stage 1 through stage 4 chronic kidney disease, or unspecified chronic kidney disease: Secondary | ICD-10-CM

## 2019-05-22 DIAGNOSIS — N1832 Chronic kidney disease, stage 3b: Secondary | ICD-10-CM

## 2019-05-22 DIAGNOSIS — I4891 Unspecified atrial fibrillation: Secondary | ICD-10-CM

## 2019-05-26 ENCOUNTER — Other Ambulatory Visit: Payer: Self-pay

## 2019-05-26 DIAGNOSIS — I5032 Chronic diastolic (congestive) heart failure: Secondary | ICD-10-CM

## 2019-05-26 DIAGNOSIS — I10 Essential (primary) hypertension: Secondary | ICD-10-CM

## 2019-05-26 MED ORDER — SPIRONOLACTONE 25 MG PO TABS: ORAL_TABLET | ORAL | 3 refills | Status: DC

## 2019-05-26 MED ORDER — LOSARTAN POTASSIUM 100 MG PO TABS: ORAL_TABLET | ORAL | 3 refills | Status: DC

## 2019-05-26 NOTE — Telephone Encounter (Signed)
Pt's medications were sent to pt's pharmacy as requested. Confirmation received.  

## 2019-05-30 ENCOUNTER — Other Ambulatory Visit: Payer: Self-pay | Admitting: Nephrology

## 2019-05-30 DIAGNOSIS — N183 Chronic kidney disease, stage 3 unspecified: Secondary | ICD-10-CM

## 2019-05-30 DIAGNOSIS — N281 Cyst of kidney, acquired: Secondary | ICD-10-CM

## 2019-06-06 ENCOUNTER — Other Ambulatory Visit: Payer: Self-pay | Admitting: Nephrology

## 2019-06-06 DIAGNOSIS — N183 Chronic kidney disease, stage 3 unspecified: Secondary | ICD-10-CM

## 2019-06-10 ENCOUNTER — Ambulatory Visit (INDEPENDENT_AMBULATORY_CARE_PROVIDER_SITE_OTHER): Payer: Medicare PPO | Admitting: *Deleted

## 2019-06-10 ENCOUNTER — Other Ambulatory Visit: Payer: Self-pay

## 2019-06-10 DIAGNOSIS — I482 Chronic atrial fibrillation, unspecified: Secondary | ICD-10-CM

## 2019-06-10 DIAGNOSIS — Z5181 Encounter for therapeutic drug level monitoring: Secondary | ICD-10-CM | POA: Diagnosis not present

## 2019-06-10 LAB — POCT INR: INR: 4.7 — AB (ref 2.0–3.0)
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
## 2019-06-10 NOTE — Patient Instructions (Signed)
Description   Hold warfarin today and tomorrow then start taking 1/2 a tablet daily except for 1 tablet on Sunday, Tuesday and Thursday. Recheck INR in 2 weeks. Call clinic with any concerns [phone].

## 2019-06-24 ENCOUNTER — Ambulatory Visit (INDEPENDENT_AMBULATORY_CARE_PROVIDER_SITE_OTHER): Payer: Medicare PPO

## 2019-06-24 ENCOUNTER — Other Ambulatory Visit: Payer: Self-pay

## 2019-06-24 DIAGNOSIS — I482 Chronic atrial fibrillation, unspecified: Secondary | ICD-10-CM | POA: Diagnosis not present

## 2019-06-24 DIAGNOSIS — Z5181 Encounter for therapeutic drug level monitoring: Secondary | ICD-10-CM

## 2019-06-24 LAB — POCT INR: INR: 3.5 — AB (ref 2.0–3.0)

## 2019-06-24 NOTE — Patient Instructions (Signed)
Description   Skip today's dosage of Warfarin, then start taking 1/2 a tablet daily except for 1 tablet on Sundays and Thursdays. Recheck INR in 2 weeks. Call clinic with any concerns [phone].

## 2019-07-02 ENCOUNTER — Other Ambulatory Visit: Payer: Medicare PPO

## 2019-07-02 DIAGNOSIS — G4733 Obstructive sleep apnea (adult) (pediatric): Secondary | ICD-10-CM | POA: Diagnosis not present

## 2019-07-05 ENCOUNTER — Ambulatory Visit
Admission: RE | Admit: 2019-07-05 | Discharge: 2019-07-05 | Disposition: A | Discharge status: Home / Self Care | Payer: Medicare PPO | Source: Ambulatory Visit | Attending: Nephrology | Admitting: Nephrology

## 2019-07-05 DIAGNOSIS — N183 Chronic kidney disease, stage 3 unspecified: Secondary | ICD-10-CM

## 2019-07-05 DIAGNOSIS — N281 Cyst of kidney, acquired: Secondary | ICD-10-CM | POA: Diagnosis not present

## 2019-07-05 MED ORDER — GADOBENATE DIMEGLUMINE 529 MG/ML IV SOLN
20.0000 mL | Freq: Once | INTRAVENOUS | Status: AC | PRN
  Administered 2019-07-05: 20 mL via INTRAVENOUS

## 2019-07-08 ENCOUNTER — Ambulatory Visit (INDEPENDENT_AMBULATORY_CARE_PROVIDER_SITE_OTHER): Payer: Medicare PPO | Admitting: Pharmacist

## 2019-07-08 ENCOUNTER — Other Ambulatory Visit: Payer: Self-pay

## 2019-07-08 DIAGNOSIS — Z5181 Encounter for therapeutic drug level monitoring: Secondary | ICD-10-CM | POA: Diagnosis not present

## 2019-07-08 DIAGNOSIS — I482 Chronic atrial fibrillation, unspecified: Secondary | ICD-10-CM | POA: Diagnosis not present

## 2019-07-08 LAB — POCT INR: INR: 2.3 (ref 2.0–3.0)

## 2019-07-08 NOTE — Patient Instructions (Signed)
Description   Continue taking 1/2 a tablet daily except for 1 tablet on Sundays and Thursdays. Recheck INR in 4 weeks. Call clinic with any concerns [phone].

## 2019-07-16 DIAGNOSIS — R3129 Other microscopic hematuria: Secondary | ICD-10-CM | POA: Diagnosis not present

## 2019-07-16 DIAGNOSIS — R350 Frequency of micturition: Secondary | ICD-10-CM | POA: Diagnosis not present

## 2019-07-18 DIAGNOSIS — R200 Anesthesia of skin: Secondary | ICD-10-CM | POA: Diagnosis not present

## 2019-07-18 DIAGNOSIS — R252 Cramp and spasm: Secondary | ICD-10-CM | POA: Diagnosis not present

## 2019-07-18 DIAGNOSIS — R7309 Other abnormal glucose: Secondary | ICD-10-CM | POA: Diagnosis not present

## 2019-07-22 ENCOUNTER — Other Ambulatory Visit: Payer: Self-pay

## 2019-07-22 MED ORDER — FUROSEMIDE 40 MG PO TABS: 40.0000 mg | ORAL_TABLET | Freq: Every day | ORAL | 2 refills | Status: DC

## 2019-07-31 DIAGNOSIS — G5600 Carpal tunnel syndrome, unspecified upper limb: Secondary | ICD-10-CM | POA: Diagnosis not present

## 2019-07-31 DIAGNOSIS — G629 Polyneuropathy, unspecified: Secondary | ICD-10-CM | POA: Diagnosis not present

## 2019-08-05 ENCOUNTER — Ambulatory Visit (INDEPENDENT_AMBULATORY_CARE_PROVIDER_SITE_OTHER): Payer: Medicare PPO

## 2019-08-05 ENCOUNTER — Other Ambulatory Visit: Payer: Self-pay

## 2019-08-05 DIAGNOSIS — I482 Chronic atrial fibrillation, unspecified: Secondary | ICD-10-CM

## 2019-08-05 DIAGNOSIS — Z5181 Encounter for therapeutic drug level monitoring: Secondary | ICD-10-CM

## 2019-08-05 LAB — POCT INR: INR: 3.3 — AB (ref 2.0–3.0)

## 2019-08-05 NOTE — Patient Instructions (Signed)
Hold Today, then start taking 1 tablet every day except for 1/2 a tablet on Sunday, Tuesday and Thursday. Recheck INR in 2 weeks. [phone]

## 2019-08-07 ENCOUNTER — Telehealth: Payer: Self-pay | Admitting: *Deleted

## 2019-08-07 DIAGNOSIS — Z86010 Personal history of colonic polyps: Secondary | ICD-10-CM | POA: Diagnosis not present

## 2019-08-07 DIAGNOSIS — K5900 Constipation, unspecified: Secondary | ICD-10-CM | POA: Diagnosis not present

## 2019-08-07 DIAGNOSIS — R635 Abnormal weight gain: Secondary | ICD-10-CM | POA: Diagnosis not present

## 2019-08-07 DIAGNOSIS — Z1211 Encounter for screening for malignant neoplasm of colon: Secondary | ICD-10-CM | POA: Diagnosis not present

## 2019-08-07 NOTE — Telephone Encounter (Signed)
Clinical pharmacist to review Coumadin ?

## 2019-08-07 NOTE — Telephone Encounter (Signed)
   [facility] Pre-operative Risk Assessment    HEARTCARE STAFF: - Please ensure there is not already an duplicate clearance open for this procedure. - Under Visit Info/Reason for Call, type in Other and utilize the format Clearance MM/DD/YY or Clearance TBD. Do not use dashes or single digits. - If request is for dental extraction, please clarify the # of teeth to be extracted.  Request for surgical clearance:  1. What type of surgery is being performed? COLONOSCOPY   2. When is this surgery scheduled? 08/27/19   3. What type of clearance is required (medical clearance vs. Pharmacy clearance to hold med vs. Both)? BOTH  4. Are there any medications that need to be held prior to surgery and how long? WARFARIN   5. Practice name and name of physician performing surgery? [facility]; DR. MANN   6. What is the office phone number? [phone]   7.   What is the office fax number? [phone]  8.   Anesthesia type (None, local, MAC, general) ? PROPOFOL   Julaine Hua 08/07/2019, 12:53 PM  _________________________________________________________________   (provider comments below)

## 2019-08-08 ENCOUNTER — Telehealth: Payer: Self-pay

## 2019-08-08 NOTE — Telephone Encounter (Signed)
Left message for the patient to call back and speak to the on call preop APP 

## 2019-08-08 NOTE — Telephone Encounter (Signed)
   Primary Cardiologist: Sinclair Grooms, MD  Chart reviewed as part of pre-operative protocol coverage. Patient was contacted 08/08/2019 in reference to pre-operative risk assessment for pending surgery as outlined below.  Kathy Montgomery was last seen on 03/18/2019 by Dr. Tamala Julian.  Since that day, Kathy Montgomery has done well without significant chest pain or worsening dyspnea.  Therefore, based on ACC/AHA guidelines, the patient would be at acceptable risk for the planned procedure without further cardiovascular testing.   I will route this recommendation to the requesting party via Epic fax function and remove from pre-op pool. Please call with questions.  Malcolm, [place] 08/08/2019, 3:19 PM

## 2019-08-08 NOTE — Telephone Encounter (Signed)
Patient returning call.

## 2019-08-08 NOTE — Telephone Encounter (Signed)
Patient with diagnosis of afib on warfarin for anticoagulation.    Procedure: COLONOSCOPY Date of procedure: 08/27/19  CHADS2-VASc score of  5 (CHF, HTN, AGE, CAD, female)  Per office protocol, patient can hold warfarin for 5 days prior to procedure.   Patient will NOT need bridging with Lovenox (enoxaparin) around procedure.

## 2019-08-08 NOTE — Telephone Encounter (Signed)
   Primary Cardiologist: Sinclair Grooms, MD  Chart reviewed as part of pre-operative protocol coverage.   IF SIMPLE EXTRACTION/CLEANINGS: Simple dental extractions are considered low risk procedures per guidelines and generally do not require any specific cardiac clearance. It is also generally accepted that for simple extractions and dental cleanings, there is no need to interrupt blood thinner therapy.  SBE prophylaxis is not required for the patient.  I will route this recommendation to the requesting party via Epic fax function and remove from pre-op pool.  Please call with questions.  [place], [place] 08/08/2019, 12:15 PM

## 2019-08-08 NOTE — Telephone Encounter (Signed)
   [facility] Pre-operative Risk Assessment    HEARTCARE STAFF: - Please ensure there is not already an duplicate clearance open for this procedure. - Under Visit Info/Reason for Call, type in Other and utilize the format Clearance MM/DD/YY or Clearance TBD. Do not use dashes or single digits. - If request is for dental extraction, please clarify the # of teeth to be extracted.  Request for surgical clearance:  1. What type of surgery is being performed? 1-2 Extractions/ cleaning    2. When is this surgery scheduled? TBD   3. What type of clearance is required (medical clearance vs. Pharmacy clearance to hold med vs. Both)? Both   4. Are there any medications that need to be held prior to surgery and how long? Warfarin   5. Practice name and name of physician performing surgery? [facility],    6. What is the office phone number? [phone]   7.   What is the office fax number? [phone]  8.   Anesthesia type (None, local, MAC, general) ? IV moderate conscious sedation    Mendel Ryder 08/08/2019, 10:41 AM  _________________________________________________________________   (provider comments below)

## 2019-08-12 DIAGNOSIS — H2513 Age-related nuclear cataract, bilateral: Secondary | ICD-10-CM | POA: Diagnosis not present

## 2019-08-12 DIAGNOSIS — R7309 Other abnormal glucose: Secondary | ICD-10-CM | POA: Diagnosis not present

## 2019-08-12 DIAGNOSIS — H02051 Trichiasis without entropian right upper eyelid: Secondary | ICD-10-CM | POA: Diagnosis not present

## 2019-08-21 ENCOUNTER — Other Ambulatory Visit: Payer: Self-pay

## 2019-08-21 ENCOUNTER — Ambulatory Visit (INDEPENDENT_AMBULATORY_CARE_PROVIDER_SITE_OTHER): Payer: Medicare PPO | Admitting: *Deleted

## 2019-08-21 DIAGNOSIS — Z5181 Encounter for therapeutic drug level monitoring: Secondary | ICD-10-CM

## 2019-08-21 DIAGNOSIS — I482 Chronic atrial fibrillation, unspecified: Secondary | ICD-10-CM

## 2019-08-21 LAB — POCT INR: INR: 1.8 — AB (ref 2.0–3.0)

## 2019-08-21 NOTE — Patient Instructions (Addendum)
Description   Take 1 tablet today, then hold warfarin 7/9,7/10,7/11, 7/12 and 7/13.  Resume Coumadin in the evening  of procedure or as directed by doctor (take an extra half tablet with usual dose for 2 days then resume normal dose). Normal dose: 1 tablet daily except for 1/2 a tablet on Sunday, Tuesday and Thursday. Recheck INR 1 week post procedure. Coumadin Clinic [phone].

## 2019-08-27 DIAGNOSIS — K635 Polyp of colon: Secondary | ICD-10-CM | POA: Diagnosis not present

## 2019-08-27 DIAGNOSIS — Z86010 Personal history of colonic polyps: Secondary | ICD-10-CM | POA: Diagnosis not present

## 2019-08-27 DIAGNOSIS — D122 Benign neoplasm of ascending colon: Secondary | ICD-10-CM | POA: Diagnosis not present

## 2019-08-27 DIAGNOSIS — D124 Benign neoplasm of descending colon: Secondary | ICD-10-CM | POA: Diagnosis not present

## 2019-08-27 DIAGNOSIS — Z1211 Encounter for screening for malignant neoplasm of colon: Secondary | ICD-10-CM | POA: Diagnosis not present

## 2019-09-02 ENCOUNTER — Telehealth: Payer: Self-pay | Admitting: *Deleted

## 2019-09-02 NOTE — Telephone Encounter (Signed)
Pt called to inform us that she resumed her Warfarin last night after having her Colonoscopy last week on 08/27/2019. She states she was told by her GI MD to hold 5-6 days and started Warfarin dose 09/01/2019. Pt has an appt on tomorrow but since resuming last pm the appt will need to be moved out a week from restart date. Made pt a new appt for pt on 09/09/2019 at 945am and she confirmed appt.

## 2019-09-09 ENCOUNTER — Other Ambulatory Visit: Payer: Self-pay

## 2019-09-09 ENCOUNTER — Ambulatory Visit (INDEPENDENT_AMBULATORY_CARE_PROVIDER_SITE_OTHER): Payer: Medicare PPO

## 2019-09-09 DIAGNOSIS — I482 Chronic atrial fibrillation, unspecified: Secondary | ICD-10-CM

## 2019-09-09 DIAGNOSIS — Z5181 Encounter for therapeutic drug level monitoring: Secondary | ICD-10-CM | POA: Diagnosis not present

## 2019-09-09 LAB — POCT INR: INR: 1.7 — AB (ref 2.0–3.0)

## 2019-09-09 NOTE — Patient Instructions (Signed)
Description   Take 1 tablet today, then resume same dosage 1 tablet daily except for 1/2 a tablet on Sundays, Tuesdays and Thursdays. Recheck INR in 7-10 days. Pt moving out of [place] to [facility].  Coumadin Clinic [phone].

## 2019-09-18 ENCOUNTER — Ambulatory Visit (INDEPENDENT_AMBULATORY_CARE_PROVIDER_SITE_OTHER): Payer: Medicare PPO | Admitting: *Deleted

## 2019-09-18 ENCOUNTER — Other Ambulatory Visit: Payer: Self-pay

## 2019-09-18 DIAGNOSIS — I482 Chronic atrial fibrillation, unspecified: Secondary | ICD-10-CM | POA: Diagnosis not present

## 2019-09-18 DIAGNOSIS — Z5181 Encounter for therapeutic drug level monitoring: Secondary | ICD-10-CM

## 2019-09-18 LAB — POCT INR: INR: 2.5 (ref 2.0–3.0)

## 2019-09-18 NOTE — Patient Instructions (Signed)
Description   Continue same dosage 1 tablet daily except for 1/2 a tablet on Sundays, Tuesdays and Thursdays. Recheck INR 3 weeks.  Pt moving out of  to [facility].  Coumadin Clinic [phone].

## 2019-09-23 DIAGNOSIS — R05 Cough: Secondary | ICD-10-CM | POA: Diagnosis not present

## 2019-09-23 DIAGNOSIS — Z20822 Contact with and (suspected) exposure to covid-19: Secondary | ICD-10-CM | POA: Diagnosis not present

## 2019-09-23 DIAGNOSIS — R519 Headache, unspecified: Secondary | ICD-10-CM | POA: Diagnosis not present

## 2019-10-09 ENCOUNTER — Telehealth: Payer: Self-pay | Admitting: Pharmacist

## 2019-10-09 DIAGNOSIS — I482 Chronic atrial fibrillation, unspecified: Secondary | ICD-10-CM | POA: Diagnosis not present

## 2019-10-09 LAB — POCT INR: INR: 1.7 — AB (ref 2.0–3.0)

## 2019-10-09 NOTE — Telephone Encounter (Signed)
Pt called and states she's at a [employer] inquiring about her labs. She was provided with lab order at her last visit with [place] before she moved to [place]. Provided her with our fax # again so that the lab in [place] can fax results to [place] (will likely receive 8/27).

## 2019-10-10 ENCOUNTER — Ambulatory Visit (INDEPENDENT_AMBULATORY_CARE_PROVIDER_SITE_OTHER): Payer: Medicare PPO | Admitting: Pharmacist

## 2019-10-10 DIAGNOSIS — Z5181 Encounter for therapeutic drug level monitoring: Secondary | ICD-10-CM | POA: Diagnosis not present

## 2019-10-10 DIAGNOSIS — G4733 Obstructive sleep apnea (adult) (pediatric): Secondary | ICD-10-CM | POA: Diagnosis not present

## 2019-10-10 NOTE — Patient Instructions (Signed)
Description    Spoke to pt instructed her to  take 1.5 tablets today and then continue same dosage 1 tablet daily except for 1/2 a tablet on Sundays, Tuesdays and Thursdays. Recheck INR 3 weeks.  Pt moved to [facility].  Coumadin Clinic [phone].

## 2019-10-10 NOTE — Telephone Encounter (Signed)
Results received. See anticoagulation encounter from 8/27 for more details.

## 2019-10-17 ENCOUNTER — Telehealth: Payer: Self-pay | Admitting: *Deleted

## 2019-10-17 ENCOUNTER — Telehealth: Payer: Self-pay | Admitting: Interventional Cardiology

## 2019-10-17 NOTE — Telephone Encounter (Signed)
Pt called and stated that she is suppose to have INR checked on 10/30/2019. Faxed order to [facility], [address] per pt request. Fax number [phone]. Pt to call coumadin clinic if she has any questions.

## 2019-10-17 NOTE — Telephone Encounter (Signed)
Spoke with pt and made her aware that Ebony Hail, [place] faxed INR order over to [facility] so she could get her INR checked on 9/16.  Pt appreciative for call.

## 2019-10-17 NOTE — Telephone Encounter (Signed)
New message:     Patient calling stating that some one called her today, but I did not see a note. Please call patient.

## 2019-10-30 ENCOUNTER — Telehealth: Payer: Self-pay | Admitting: Pharmacist

## 2019-10-30 DIAGNOSIS — Z7901 Long term (current) use of anticoagulants: Secondary | ICD-10-CM | POA: Diagnosis not present

## 2019-10-30 DIAGNOSIS — Z79899 Other long term (current) drug therapy: Secondary | ICD-10-CM | POA: Diagnosis not present

## 2019-10-30 LAB — PROTIME-INR: INR: 1.3 — AB (ref 0.9–1.1)

## 2019-10-30 NOTE — Telephone Encounter (Signed)
Patient called, requesting new order be faxed to [facility].  Send order for weekly INR testing for next 6 months to [phone]

## 2019-10-31 ENCOUNTER — Ambulatory Visit (INDEPENDENT_AMBULATORY_CARE_PROVIDER_SITE_OTHER): Payer: Medicare PPO | Admitting: Cardiovascular Disease

## 2019-10-31 DIAGNOSIS — Z5181 Encounter for therapeutic drug level monitoring: Secondary | ICD-10-CM | POA: Diagnosis not present

## 2019-10-31 NOTE — Progress Notes (Signed)
Went over warfarin dosing instructions with pt day by day with read back. Pt requested we resend a fax to [facility]. She stated that they are getting faxes but the ink is not very clear.

## 2019-10-31 NOTE — Patient Instructions (Addendum)
Description    Spoke to pt instructed her to  take 1.5 (7.5mg ) tablets today and tomorrow, then start taking 1 tablet (5mg ) daily except for 1/2 a tablet  (2.5mg ) on Sundays,  and Thursdays. Recheck INR 1.5 weeks.  Pt moved to [facility] is suppose to get established with PCP on 10/6. Coumadin Clinic [phone].

## 2019-11-10 ENCOUNTER — Telehealth: Payer: Self-pay | Admitting: Pharmacist

## 2019-11-10 DIAGNOSIS — I480 Paroxysmal atrial fibrillation: Secondary | ICD-10-CM | POA: Diagnosis not present

## 2019-11-10 DIAGNOSIS — Z5181 Encounter for therapeutic drug level monitoring: Secondary | ICD-10-CM | POA: Diagnosis not present

## 2019-11-10 LAB — POCT INR: INR: 3.1 — AB (ref 2.0–3.0)

## 2019-11-10 NOTE — Telephone Encounter (Signed)
Patient called stating that she cannot have a standing order since her MD is out of office. Requesting that we send an order for an INR for today. Fax # [phone] Order faxed over at 8:41 AM

## 2019-11-11 ENCOUNTER — Ambulatory Visit (INDEPENDENT_AMBULATORY_CARE_PROVIDER_SITE_OTHER): Payer: Medicare PPO | Admitting: Pharmacist

## 2019-11-11 DIAGNOSIS — Z5181 Encounter for therapeutic drug level monitoring: Secondary | ICD-10-CM

## 2019-11-11 DIAGNOSIS — I480 Paroxysmal atrial fibrillation: Secondary | ICD-10-CM | POA: Diagnosis not present

## 2019-11-24 DIAGNOSIS — Z79899 Other long term (current) drug therapy: Secondary | ICD-10-CM | POA: Diagnosis not present

## 2019-11-24 DIAGNOSIS — R200 Anesthesia of skin: Secondary | ICD-10-CM | POA: Diagnosis not present

## 2019-11-24 DIAGNOSIS — Z23 Encounter for immunization: Secondary | ICD-10-CM | POA: Diagnosis not present

## 2019-11-24 DIAGNOSIS — I480 Paroxysmal atrial fibrillation: Secondary | ICD-10-CM | POA: Diagnosis not present

## 2019-11-24 DIAGNOSIS — Z1231 Encounter for screening mammogram for malignant neoplasm of breast: Secondary | ICD-10-CM | POA: Diagnosis not present

## 2019-11-24 DIAGNOSIS — R7303 Prediabetes: Secondary | ICD-10-CM | POA: Diagnosis not present

## 2019-11-24 DIAGNOSIS — N289 Disorder of kidney and ureter, unspecified: Secondary | ICD-10-CM | POA: Diagnosis not present

## 2019-12-12 DIAGNOSIS — Z1231 Encounter for screening mammogram for malignant neoplasm of breast: Secondary | ICD-10-CM | POA: Diagnosis not present

## 2019-12-17 DIAGNOSIS — I480 Paroxysmal atrial fibrillation: Secondary | ICD-10-CM | POA: Diagnosis not present

## 2019-12-17 DIAGNOSIS — Z79899 Other long term (current) drug therapy: Secondary | ICD-10-CM | POA: Diagnosis not present

## 2019-12-17 DIAGNOSIS — N289 Disorder of kidney and ureter, unspecified: Secondary | ICD-10-CM | POA: Diagnosis not present

## 2019-12-17 DIAGNOSIS — R7303 Prediabetes: Secondary | ICD-10-CM | POA: Diagnosis not present

## 2019-12-17 DIAGNOSIS — I10 Essential (primary) hypertension: Secondary | ICD-10-CM | POA: Diagnosis not present

## 2019-12-17 DIAGNOSIS — I119 Hypertensive heart disease without heart failure: Secondary | ICD-10-CM | POA: Diagnosis not present

## 2019-12-17 DIAGNOSIS — I252 Old myocardial infarction: Secondary | ICD-10-CM | POA: Diagnosis not present

## 2019-12-17 DIAGNOSIS — E782 Mixed hyperlipidemia: Secondary | ICD-10-CM | POA: Diagnosis not present

## 2019-12-17 DIAGNOSIS — R200 Anesthesia of skin: Secondary | ICD-10-CM | POA: Diagnosis not present

## 2019-12-24 DIAGNOSIS — I480 Paroxysmal atrial fibrillation: Secondary | ICD-10-CM | POA: Diagnosis not present

## 2019-12-24 DIAGNOSIS — I252 Old myocardial infarction: Secondary | ICD-10-CM | POA: Diagnosis not present

## 2020-01-07 DIAGNOSIS — R928 Other abnormal and inconclusive findings on diagnostic imaging of breast: Secondary | ICD-10-CM | POA: Diagnosis not present

## 2020-01-07 DIAGNOSIS — R920 Mammographic microcalcification found on diagnostic imaging of breast: Secondary | ICD-10-CM | POA: Diagnosis not present

## 2020-01-07 DIAGNOSIS — R921 Mammographic calcification found on diagnostic imaging of breast: Secondary | ICD-10-CM | POA: Diagnosis not present

## 2020-01-14 ENCOUNTER — Telehealth: Payer: Self-pay | Admitting: Interventional Cardiology

## 2020-01-14 NOTE — Telephone Encounter (Signed)
Patient wanted to advise Dr. Tamala Julian and his nurse that [place].

## 2020-01-14 NOTE — Telephone Encounter (Signed)
Thanks

## 2020-01-21 DIAGNOSIS — Z20822 Contact with and (suspected) exposure to covid-19: Secondary | ICD-10-CM | POA: Diagnosis not present

## 2020-01-21 DIAGNOSIS — J069 Acute upper respiratory infection, unspecified: Secondary | ICD-10-CM | POA: Diagnosis not present

## 2020-01-26 DIAGNOSIS — I2510 Atherosclerotic heart disease of native coronary artery without angina pectoris: Secondary | ICD-10-CM | POA: Diagnosis not present

## 2020-01-26 DIAGNOSIS — I252 Old myocardial infarction: Secondary | ICD-10-CM | POA: Diagnosis not present

## 2020-02-29 DIAGNOSIS — Z1152 Encounter for screening for COVID-19: Secondary | ICD-10-CM | POA: Diagnosis not present

## 2020-02-29 DIAGNOSIS — Z20822 Contact with and (suspected) exposure to covid-19: Secondary | ICD-10-CM | POA: Diagnosis not present

## 2020-03-03 DIAGNOSIS — R200 Anesthesia of skin: Secondary | ICD-10-CM | POA: Diagnosis not present

## 2020-03-03 DIAGNOSIS — M792 Neuralgia and neuritis, unspecified: Secondary | ICD-10-CM | POA: Diagnosis not present

## 2020-03-08 ENCOUNTER — Other Ambulatory Visit: Payer: Self-pay

## 2020-03-08 DIAGNOSIS — I5032 Chronic diastolic (congestive) heart failure: Secondary | ICD-10-CM

## 2020-03-08 DIAGNOSIS — I10 Essential (primary) hypertension: Secondary | ICD-10-CM

## 2020-03-08 MED ORDER — ROSUVASTATIN CALCIUM 5 MG PO TABS: 5.0000 mg | ORAL_TABLET | Freq: Every day | ORAL | 0 refills | Status: AC

## 2020-03-08 MED ORDER — FUROSEMIDE 40 MG PO TABS: 40.0000 mg | ORAL_TABLET | Freq: Every day | ORAL | 0 refills | Status: AC

## 2020-03-08 MED ORDER — LOSARTAN POTASSIUM 100 MG PO TABS: ORAL_TABLET | ORAL | 0 refills | Status: DC

## 2020-03-08 MED ORDER — LOSARTAN POTASSIUM 100 MG PO TABS: ORAL_TABLET | ORAL | 0 refills | Status: AC

## 2020-03-08 NOTE — Addendum Note (Signed)
Addended by: Carter Kitten D on: 03/08/2020 12:24 PM   Modules accepted: Orders

## 2020-03-11 DIAGNOSIS — G4733 Obstructive sleep apnea (adult) (pediatric): Secondary | ICD-10-CM | POA: Diagnosis not present

## 2020-03-15 DIAGNOSIS — N183 Chronic kidney disease, stage 3 unspecified: Secondary | ICD-10-CM | POA: Diagnosis not present

## 2020-03-15 DIAGNOSIS — I10 Essential (primary) hypertension: Secondary | ICD-10-CM | POA: Diagnosis not present

## 2020-03-18 DIAGNOSIS — I480 Paroxysmal atrial fibrillation: Secondary | ICD-10-CM | POA: Diagnosis not present

## 2020-03-18 DIAGNOSIS — I252 Old myocardial infarction: Secondary | ICD-10-CM | POA: Diagnosis not present

## 2020-03-18 DIAGNOSIS — I119 Hypertensive heart disease without heart failure: Secondary | ICD-10-CM | POA: Diagnosis not present

## 2020-03-18 DIAGNOSIS — G4733 Obstructive sleep apnea (adult) (pediatric): Secondary | ICD-10-CM | POA: Diagnosis not present

## 2020-03-18 DIAGNOSIS — N289 Disorder of kidney and ureter, unspecified: Secondary | ICD-10-CM | POA: Diagnosis not present

## 2020-03-18 DIAGNOSIS — R7303 Prediabetes: Secondary | ICD-10-CM | POA: Diagnosis not present

## 2020-03-18 DIAGNOSIS — Z9989 Dependence on other enabling machines and devices: Secondary | ICD-10-CM | POA: Diagnosis not present

## 2020-03-18 DIAGNOSIS — E782 Mixed hyperlipidemia: Secondary | ICD-10-CM | POA: Diagnosis not present

## 2020-03-18 DIAGNOSIS — I10 Essential (primary) hypertension: Secondary | ICD-10-CM | POA: Diagnosis not present

## 2020-03-20 DIAGNOSIS — Z1152 Encounter for screening for COVID-19: Secondary | ICD-10-CM | POA: Diagnosis not present

## 2020-03-20 DIAGNOSIS — Z20822 Contact with and (suspected) exposure to covid-19: Secondary | ICD-10-CM | POA: Diagnosis not present

## 2020-03-22 DIAGNOSIS — R921 Mammographic calcification found on diagnostic imaging of breast: Secondary | ICD-10-CM | POA: Diagnosis not present

## 2020-03-22 DIAGNOSIS — N6031 Fibrosclerosis of right breast: Secondary | ICD-10-CM | POA: Diagnosis not present

## 2020-03-25 DIAGNOSIS — N183 Chronic kidney disease, stage 3 unspecified: Secondary | ICD-10-CM | POA: Diagnosis not present

## 2020-03-29 ENCOUNTER — Other Ambulatory Visit: Payer: Self-pay

## 2020-03-29 DIAGNOSIS — M792 Neuralgia and neuritis, unspecified: Secondary | ICD-10-CM | POA: Diagnosis not present

## 2020-03-29 DIAGNOSIS — G5603 Carpal tunnel syndrome, bilateral upper limbs: Secondary | ICD-10-CM | POA: Diagnosis not present

## 2020-03-29 MED ORDER — SPIRONOLACTONE 25 MG PO TABS: ORAL_TABLET | ORAL | 0 refills | Status: DC

## 2020-04-02 DIAGNOSIS — N183 Chronic kidney disease, stage 3 unspecified: Secondary | ICD-10-CM | POA: Diagnosis not present

## 2020-04-02 DIAGNOSIS — N281 Cyst of kidney, acquired: Secondary | ICD-10-CM | POA: Diagnosis not present

## 2020-04-02 DIAGNOSIS — I10 Essential (primary) hypertension: Secondary | ICD-10-CM | POA: Diagnosis not present

## 2020-04-06 DIAGNOSIS — Z20822 Contact with and (suspected) exposure to covid-19: Secondary | ICD-10-CM | POA: Diagnosis not present

## 2020-04-06 DIAGNOSIS — Z1152 Encounter for screening for COVID-19: Secondary | ICD-10-CM | POA: Diagnosis not present

## 2020-04-21 DIAGNOSIS — N6489 Other specified disorders of breast: Secondary | ICD-10-CM | POA: Diagnosis not present

## 2020-04-28 ENCOUNTER — Other Ambulatory Visit: Payer: Self-pay | Admitting: Interventional Cardiology

## 2020-05-04 DIAGNOSIS — Z9889 Other specified postprocedural states: Secondary | ICD-10-CM | POA: Diagnosis not present

## 2020-05-04 DIAGNOSIS — Z01419 Encounter for gynecological examination (general) (routine) without abnormal findings: Secondary | ICD-10-CM | POA: Diagnosis not present

## 2020-05-05 DIAGNOSIS — Z20822 Contact with and (suspected) exposure to covid-19: Secondary | ICD-10-CM | POA: Diagnosis not present

## 2020-05-05 DIAGNOSIS — Z1152 Encounter for screening for COVID-19: Secondary | ICD-10-CM | POA: Diagnosis not present

## 2020-05-08 DIAGNOSIS — N281 Cyst of kidney, acquired: Secondary | ICD-10-CM | POA: Diagnosis not present

## 2020-05-12 DIAGNOSIS — N281 Cyst of kidney, acquired: Secondary | ICD-10-CM | POA: Diagnosis not present

## 2020-05-12 DIAGNOSIS — R3121 Asymptomatic microscopic hematuria: Secondary | ICD-10-CM | POA: Diagnosis not present

## 2020-05-18 DIAGNOSIS — R3121 Asymptomatic microscopic hematuria: Secondary | ICD-10-CM | POA: Diagnosis not present

## 2020-05-18 DIAGNOSIS — N281 Cyst of kidney, acquired: Secondary | ICD-10-CM | POA: Diagnosis not present

## 2020-05-31 DIAGNOSIS — Z20822 Contact with and (suspected) exposure to covid-19: Secondary | ICD-10-CM | POA: Diagnosis not present

## 2020-05-31 DIAGNOSIS — Z1152 Encounter for screening for COVID-19: Secondary | ICD-10-CM | POA: Diagnosis not present

## 2020-06-09 DIAGNOSIS — G4733 Obstructive sleep apnea (adult) (pediatric): Secondary | ICD-10-CM | POA: Diagnosis not present

## 2020-06-14 DIAGNOSIS — E119 Type 2 diabetes mellitus without complications: Secondary | ICD-10-CM | POA: Diagnosis not present

## 2020-06-14 DIAGNOSIS — I501 Left ventricular failure: Secondary | ICD-10-CM | POA: Diagnosis not present

## 2020-06-14 DIAGNOSIS — I779 Disorder of arteries and arterioles, unspecified: Secondary | ICD-10-CM | POA: Diagnosis not present

## 2020-06-14 DIAGNOSIS — H269 Unspecified cataract: Secondary | ICD-10-CM | POA: Diagnosis not present

## 2020-06-14 DIAGNOSIS — I110 Hypertensive heart disease with heart failure: Secondary | ICD-10-CM | POA: Diagnosis not present

## 2020-06-21 ENCOUNTER — Other Ambulatory Visit: Payer: Self-pay | Admitting: Interventional Cardiology

## 2020-06-21 DIAGNOSIS — I5032 Chronic diastolic (congestive) heart failure: Secondary | ICD-10-CM

## 2020-06-21 DIAGNOSIS — I10 Essential (primary) hypertension: Secondary | ICD-10-CM

## 2020-07-05 DIAGNOSIS — G5603 Carpal tunnel syndrome, bilateral upper limbs: Secondary | ICD-10-CM | POA: Diagnosis not present

## 2020-07-09 DIAGNOSIS — E210 Primary hyperparathyroidism: Secondary | ICD-10-CM | POA: Diagnosis not present

## 2020-07-09 DIAGNOSIS — R7303 Prediabetes: Secondary | ICD-10-CM | POA: Diagnosis not present

## 2020-07-09 DIAGNOSIS — N1832 Chronic kidney disease, stage 3b: Secondary | ICD-10-CM | POA: Diagnosis not present

## 2020-07-20 DIAGNOSIS — I480 Paroxysmal atrial fibrillation: Secondary | ICD-10-CM | POA: Diagnosis not present

## 2020-07-20 DIAGNOSIS — Z01810 Encounter for preprocedural cardiovascular examination: Secondary | ICD-10-CM | POA: Diagnosis not present

## 2020-07-20 DIAGNOSIS — Z01818 Encounter for other preprocedural examination: Secondary | ICD-10-CM | POA: Diagnosis not present

## 2020-07-20 DIAGNOSIS — G5601 Carpal tunnel syndrome, right upper limb: Secondary | ICD-10-CM | POA: Diagnosis not present

## 2020-07-20 DIAGNOSIS — N289 Disorder of kidney and ureter, unspecified: Secondary | ICD-10-CM | POA: Diagnosis not present

## 2020-07-20 DIAGNOSIS — I10 Essential (primary) hypertension: Secondary | ICD-10-CM | POA: Diagnosis not present

## 2020-07-20 DIAGNOSIS — R7303 Prediabetes: Secondary | ICD-10-CM | POA: Diagnosis not present

## 2020-07-20 DIAGNOSIS — I119 Hypertensive heart disease without heart failure: Secondary | ICD-10-CM | POA: Diagnosis not present

## 2020-07-20 DIAGNOSIS — E782 Mixed hyperlipidemia: Secondary | ICD-10-CM | POA: Diagnosis not present

## 2020-07-20 DIAGNOSIS — I252 Old myocardial infarction: Secondary | ICD-10-CM | POA: Diagnosis not present

## 2020-07-26 DIAGNOSIS — Z20822 Contact with and (suspected) exposure to covid-19: Secondary | ICD-10-CM | POA: Diagnosis not present

## 2020-07-26 DIAGNOSIS — Z1152 Encounter for screening for COVID-19: Secondary | ICD-10-CM | POA: Diagnosis not present

## 2020-07-29 DIAGNOSIS — N183 Chronic kidney disease, stage 3 unspecified: Secondary | ICD-10-CM | POA: Diagnosis not present

## 2020-07-29 DIAGNOSIS — N281 Cyst of kidney, acquired: Secondary | ICD-10-CM | POA: Diagnosis not present

## 2020-07-29 DIAGNOSIS — I10 Essential (primary) hypertension: Secondary | ICD-10-CM | POA: Diagnosis not present

## 2020-07-30 DIAGNOSIS — I129 Hypertensive chronic kidney disease with stage 1 through stage 4 chronic kidney disease, or unspecified chronic kidney disease: Secondary | ICD-10-CM | POA: Diagnosis not present

## 2020-07-30 DIAGNOSIS — Z6836 Body mass index (BMI) 36.0-36.9, adult: Secondary | ICD-10-CM | POA: Diagnosis not present

## 2020-07-30 DIAGNOSIS — I2510 Atherosclerotic heart disease of native coronary artery without angina pectoris: Secondary | ICD-10-CM | POA: Diagnosis not present

## 2020-07-30 DIAGNOSIS — N183 Chronic kidney disease, stage 3 unspecified: Secondary | ICD-10-CM | POA: Diagnosis not present

## 2020-07-30 DIAGNOSIS — E668 Other obesity: Secondary | ICD-10-CM | POA: Diagnosis not present

## 2020-07-30 DIAGNOSIS — I10 Essential (primary) hypertension: Secondary | ICD-10-CM | POA: Diagnosis not present

## 2020-07-30 DIAGNOSIS — E785 Hyperlipidemia, unspecified: Secondary | ICD-10-CM | POA: Diagnosis not present

## 2020-07-30 DIAGNOSIS — I4891 Unspecified atrial fibrillation: Secondary | ICD-10-CM | POA: Diagnosis not present

## 2020-07-30 DIAGNOSIS — G5601 Carpal tunnel syndrome, right upper limb: Secondary | ICD-10-CM | POA: Diagnosis not present

## 2020-08-25 DIAGNOSIS — Z20822 Contact with and (suspected) exposure to covid-19: Secondary | ICD-10-CM | POA: Diagnosis not present

## 2020-08-25 DIAGNOSIS — Z1152 Encounter for screening for COVID-19: Secondary | ICD-10-CM | POA: Diagnosis not present

## 2020-09-04 DIAGNOSIS — I4891 Unspecified atrial fibrillation: Secondary | ICD-10-CM | POA: Diagnosis not present

## 2020-09-06 DIAGNOSIS — Z20822 Contact with and (suspected) exposure to covid-19: Secondary | ICD-10-CM | POA: Diagnosis not present

## 2020-09-06 DIAGNOSIS — Z1152 Encounter for screening for COVID-19: Secondary | ICD-10-CM | POA: Diagnosis not present

## 2020-09-20 DIAGNOSIS — Z20822 Contact with and (suspected) exposure to covid-19: Secondary | ICD-10-CM | POA: Diagnosis not present

## 2020-09-20 DIAGNOSIS — Z1152 Encounter for screening for COVID-19: Secondary | ICD-10-CM | POA: Diagnosis not present

## 2020-10-04 DIAGNOSIS — Z20822 Contact with and (suspected) exposure to covid-19: Secondary | ICD-10-CM | POA: Diagnosis not present

## 2020-10-04 DIAGNOSIS — Z1152 Encounter for screening for COVID-19: Secondary | ICD-10-CM | POA: Diagnosis not present

## 2020-10-05 DIAGNOSIS — R3121 Asymptomatic microscopic hematuria: Secondary | ICD-10-CM | POA: Diagnosis not present

## 2020-10-05 DIAGNOSIS — N281 Cyst of kidney, acquired: Secondary | ICD-10-CM | POA: Diagnosis not present

## 2020-10-06 DIAGNOSIS — I4891 Unspecified atrial fibrillation: Secondary | ICD-10-CM | POA: Diagnosis not present

## 2020-10-16 DIAGNOSIS — Z1152 Encounter for screening for COVID-19: Secondary | ICD-10-CM | POA: Diagnosis not present

## 2020-10-16 DIAGNOSIS — Z20822 Contact with and (suspected) exposure to covid-19: Secondary | ICD-10-CM | POA: Diagnosis not present

## 2020-10-21 DIAGNOSIS — R922 Inconclusive mammogram: Secondary | ICD-10-CM | POA: Diagnosis not present

## 2020-10-21 DIAGNOSIS — R928 Other abnormal and inconclusive findings on diagnostic imaging of breast: Secondary | ICD-10-CM | POA: Diagnosis not present

## 2020-10-21 DIAGNOSIS — N6489 Other specified disorders of breast: Secondary | ICD-10-CM | POA: Diagnosis not present

## 2020-10-28 DIAGNOSIS — N281 Cyst of kidney, acquired: Secondary | ICD-10-CM | POA: Diagnosis not present

## 2020-10-28 DIAGNOSIS — N289 Disorder of kidney and ureter, unspecified: Secondary | ICD-10-CM | POA: Diagnosis not present

## 2020-10-28 DIAGNOSIS — K7689 Other specified diseases of liver: Secondary | ICD-10-CM | POA: Diagnosis not present

## 2020-11-06 DIAGNOSIS — I4891 Unspecified atrial fibrillation: Secondary | ICD-10-CM | POA: Diagnosis not present

## 2020-11-24 DIAGNOSIS — N289 Disorder of kidney and ureter, unspecified: Secondary | ICD-10-CM | POA: Diagnosis not present

## 2020-11-24 DIAGNOSIS — E782 Mixed hyperlipidemia: Secondary | ICD-10-CM | POA: Diagnosis not present

## 2020-11-24 DIAGNOSIS — E210 Primary hyperparathyroidism: Secondary | ICD-10-CM | POA: Diagnosis not present

## 2020-11-24 DIAGNOSIS — E559 Vitamin D deficiency, unspecified: Secondary | ICD-10-CM | POA: Diagnosis not present

## 2020-11-24 DIAGNOSIS — I480 Paroxysmal atrial fibrillation: Secondary | ICD-10-CM | POA: Diagnosis not present

## 2020-11-24 DIAGNOSIS — R7309 Other abnormal glucose: Secondary | ICD-10-CM | POA: Diagnosis not present

## 2020-11-24 DIAGNOSIS — N183 Chronic kidney disease, stage 3 unspecified: Secondary | ICD-10-CM | POA: Diagnosis not present

## 2020-12-09 DIAGNOSIS — Z1152 Encounter for screening for COVID-19: Secondary | ICD-10-CM | POA: Diagnosis not present

## 2020-12-09 DIAGNOSIS — Z20822 Contact with and (suspected) exposure to covid-19: Secondary | ICD-10-CM | POA: Diagnosis not present

## 2020-12-10 DIAGNOSIS — D329 Benign neoplasm of meninges, unspecified: Secondary | ICD-10-CM | POA: Diagnosis not present

## 2020-12-10 DIAGNOSIS — G9389 Other specified disorders of brain: Secondary | ICD-10-CM | POA: Diagnosis not present

## 2020-12-10 DIAGNOSIS — I10 Essential (primary) hypertension: Secondary | ICD-10-CM | POA: Diagnosis not present

## 2020-12-10 DIAGNOSIS — R059 Cough, unspecified: Secondary | ICD-10-CM | POA: Diagnosis not present

## 2020-12-10 DIAGNOSIS — Z0189 Encounter for other specified special examinations: Secondary | ICD-10-CM | POA: Diagnosis not present

## 2020-12-10 DIAGNOSIS — R0981 Nasal congestion: Secondary | ICD-10-CM | POA: Diagnosis not present

## 2020-12-10 DIAGNOSIS — R519 Headache, unspecified: Secondary | ICD-10-CM | POA: Diagnosis not present

## 2020-12-11 DIAGNOSIS — Z0189 Encounter for other specified special examinations: Secondary | ICD-10-CM | POA: Diagnosis not present

## 2021-02-23 DIAGNOSIS — E210 Primary hyperparathyroidism: Secondary | ICD-10-CM | POA: Diagnosis not present

## 2021-02-23 DIAGNOSIS — E042 Nontoxic multinodular goiter: Secondary | ICD-10-CM | POA: Diagnosis not present

## 2021-03-02 DIAGNOSIS — D329 Benign neoplasm of meninges, unspecified: Secondary | ICD-10-CM | POA: Diagnosis not present

## 2021-03-02 DIAGNOSIS — E210 Primary hyperparathyroidism: Secondary | ICD-10-CM | POA: Diagnosis not present

## 2021-03-02 DIAGNOSIS — E041 Nontoxic single thyroid nodule: Secondary | ICD-10-CM | POA: Diagnosis not present

## 2021-03-02 DIAGNOSIS — N1831 Chronic kidney disease, stage 3a: Secondary | ICD-10-CM | POA: Diagnosis not present

## 2021-03-02 DIAGNOSIS — E236 Other disorders of pituitary gland: Secondary | ICD-10-CM | POA: Diagnosis not present

## 2021-03-11 DIAGNOSIS — E042 Nontoxic multinodular goiter: Secondary | ICD-10-CM | POA: Diagnosis not present

## 2021-03-21 DIAGNOSIS — R930 Abnormal findings on diagnostic imaging of skull and head, not elsewhere classified: Secondary | ICD-10-CM | POA: Diagnosis not present

## 2021-03-23 DIAGNOSIS — E213 Hyperparathyroidism, unspecified: Secondary | ICD-10-CM | POA: Diagnosis not present

## 2021-04-06 DIAGNOSIS — I10 Essential (primary) hypertension: Secondary | ICD-10-CM | POA: Diagnosis not present

## 2021-04-06 DIAGNOSIS — N183 Chronic kidney disease, stage 3 unspecified: Secondary | ICD-10-CM | POA: Diagnosis not present

## 2021-04-11 DIAGNOSIS — E210 Primary hyperparathyroidism: Secondary | ICD-10-CM | POA: Diagnosis not present

## 2021-04-11 DIAGNOSIS — I10 Essential (primary) hypertension: Secondary | ICD-10-CM | POA: Diagnosis not present

## 2021-04-11 DIAGNOSIS — Z01810 Encounter for preprocedural cardiovascular examination: Secondary | ICD-10-CM | POA: Diagnosis not present

## 2021-04-11 DIAGNOSIS — E782 Mixed hyperlipidemia: Secondary | ICD-10-CM | POA: Diagnosis not present

## 2021-04-11 DIAGNOSIS — R7303 Prediabetes: Secondary | ICD-10-CM | POA: Diagnosis not present

## 2021-04-11 DIAGNOSIS — I119 Hypertensive heart disease without heart failure: Secondary | ICD-10-CM | POA: Diagnosis not present

## 2021-04-11 DIAGNOSIS — G4733 Obstructive sleep apnea (adult) (pediatric): Secondary | ICD-10-CM | POA: Diagnosis not present

## 2021-04-11 DIAGNOSIS — I252 Old myocardial infarction: Secondary | ICD-10-CM | POA: Diagnosis not present

## 2021-04-11 DIAGNOSIS — G5602 Carpal tunnel syndrome, left upper limb: Secondary | ICD-10-CM | POA: Diagnosis not present

## 2021-04-13 DIAGNOSIS — E210 Primary hyperparathyroidism: Secondary | ICD-10-CM | POA: Diagnosis not present

## 2021-04-14 DIAGNOSIS — Z1152 Encounter for screening for COVID-19: Secondary | ICD-10-CM | POA: Diagnosis not present

## 2021-04-14 DIAGNOSIS — N183 Chronic kidney disease, stage 3 unspecified: Secondary | ICD-10-CM | POA: Diagnosis not present

## 2021-04-14 DIAGNOSIS — E559 Vitamin D deficiency, unspecified: Secondary | ICD-10-CM | POA: Diagnosis not present

## 2021-04-14 DIAGNOSIS — E210 Primary hyperparathyroidism: Secondary | ICD-10-CM | POA: Diagnosis not present

## 2021-04-14 DIAGNOSIS — D352 Benign neoplasm of pituitary gland: Secondary | ICD-10-CM | POA: Diagnosis not present

## 2021-04-14 DIAGNOSIS — Z20822 Contact with and (suspected) exposure to covid-19: Secondary | ICD-10-CM | POA: Diagnosis not present

## 2021-04-15 DIAGNOSIS — Z01810 Encounter for preprocedural cardiovascular examination: Secondary | ICD-10-CM | POA: Diagnosis not present

## 2021-04-15 DIAGNOSIS — G5602 Carpal tunnel syndrome, left upper limb: Secondary | ICD-10-CM | POA: Diagnosis not present

## 2021-04-15 DIAGNOSIS — Z01818 Encounter for other preprocedural examination: Secondary | ICD-10-CM | POA: Diagnosis not present

## 2021-04-21 DIAGNOSIS — I472 Ventricular tachycardia, unspecified: Secondary | ICD-10-CM | POA: Diagnosis not present

## 2021-04-21 DIAGNOSIS — R9431 Abnormal electrocardiogram [ECG] [EKG]: Secondary | ICD-10-CM | POA: Diagnosis not present

## 2021-04-21 DIAGNOSIS — R112 Nausea with vomiting, unspecified: Secondary | ICD-10-CM | POA: Diagnosis not present

## 2021-04-21 DIAGNOSIS — I8000 Phlebitis and thrombophlebitis of superficial vessels of unspecified lower extremity: Secondary | ICD-10-CM | POA: Diagnosis not present

## 2021-04-21 DIAGNOSIS — I480 Paroxysmal atrial fibrillation: Secondary | ICD-10-CM | POA: Diagnosis not present

## 2021-04-21 DIAGNOSIS — N179 Acute kidney failure, unspecified: Secondary | ICD-10-CM | POA: Diagnosis not present

## 2021-04-21 DIAGNOSIS — Z0189 Encounter for other specified special examinations: Secondary | ICD-10-CM | POA: Diagnosis not present

## 2021-04-21 DIAGNOSIS — I513 Intracardiac thrombosis, not elsewhere classified: Secondary | ICD-10-CM | POA: Diagnosis not present

## 2021-04-21 DIAGNOSIS — I5021 Acute systolic (congestive) heart failure: Secondary | ICD-10-CM | POA: Diagnosis not present

## 2021-04-21 DIAGNOSIS — I4891 Unspecified atrial fibrillation: Secondary | ICD-10-CM | POA: Diagnosis not present

## 2021-04-21 DIAGNOSIS — R079 Chest pain, unspecified: Secondary | ICD-10-CM | POA: Diagnosis not present

## 2021-04-21 DIAGNOSIS — Z4682 Encounter for fitting and adjustment of non-vascular catheter: Secondary | ICD-10-CM | POA: Diagnosis not present

## 2021-04-21 DIAGNOSIS — M79609 Pain in unspecified limb: Secondary | ICD-10-CM | POA: Diagnosis not present

## 2021-04-21 DIAGNOSIS — I4901 Ventricular fibrillation: Secondary | ICD-10-CM | POA: Diagnosis not present

## 2021-04-21 DIAGNOSIS — N189 Chronic kidney disease, unspecified: Secondary | ICD-10-CM | POA: Diagnosis not present

## 2021-04-21 DIAGNOSIS — J810 Acute pulmonary edema: Secondary | ICD-10-CM | POA: Diagnosis not present

## 2021-04-21 DIAGNOSIS — J811 Chronic pulmonary edema: Secondary | ICD-10-CM | POA: Diagnosis not present

## 2021-04-21 DIAGNOSIS — J90 Pleural effusion, not elsewhere classified: Secondary | ICD-10-CM | POA: Diagnosis not present

## 2021-04-21 DIAGNOSIS — I129 Hypertensive chronic kidney disease with stage 1 through stage 4 chronic kidney disease, or unspecified chronic kidney disease: Secondary | ICD-10-CM | POA: Diagnosis not present

## 2021-04-21 DIAGNOSIS — I80293 Phlebitis and thrombophlebitis of other deep vessels of lower extremity, bilateral: Secondary | ICD-10-CM | POA: Diagnosis not present

## 2021-04-21 DIAGNOSIS — I4892 Unspecified atrial flutter: Secondary | ICD-10-CM | POA: Diagnosis not present

## 2021-04-21 DIAGNOSIS — I2109 ST elevation (STEMI) myocardial infarction involving other coronary artery of anterior wall: Secondary | ICD-10-CM | POA: Diagnosis not present

## 2021-04-21 DIAGNOSIS — I471 Supraventricular tachycardia: Secondary | ICD-10-CM | POA: Diagnosis not present

## 2021-04-21 DIAGNOSIS — G9389 Other specified disorders of brain: Secondary | ICD-10-CM | POA: Diagnosis not present

## 2021-04-21 DIAGNOSIS — M7989 Other specified soft tissue disorders: Secondary | ICD-10-CM | POA: Diagnosis not present

## 2021-04-21 DIAGNOSIS — R918 Other nonspecific abnormal finding of lung field: Secondary | ICD-10-CM | POA: Diagnosis not present

## 2021-04-21 DIAGNOSIS — I213 ST elevation (STEMI) myocardial infarction of unspecified site: Secondary | ICD-10-CM | POA: Diagnosis not present

## 2021-05-03 DIAGNOSIS — G4733 Obstructive sleep apnea (adult) (pediatric): Secondary | ICD-10-CM | POA: Diagnosis not present

## 2021-05-03 DIAGNOSIS — I480 Paroxysmal atrial fibrillation: Secondary | ICD-10-CM | POA: Diagnosis not present

## 2021-05-03 DIAGNOSIS — E213 Hyperparathyroidism, unspecified: Secondary | ICD-10-CM | POA: Diagnosis not present

## 2021-05-03 DIAGNOSIS — I213 ST elevation (STEMI) myocardial infarction of unspecified site: Secondary | ICD-10-CM | POA: Diagnosis not present

## 2021-05-03 DIAGNOSIS — R7303 Prediabetes: Secondary | ICD-10-CM | POA: Diagnosis not present

## 2021-05-03 DIAGNOSIS — E785 Hyperlipidemia, unspecified: Secondary | ICD-10-CM | POA: Diagnosis not present

## 2021-05-03 DIAGNOSIS — I129 Hypertensive chronic kidney disease with stage 1 through stage 4 chronic kidney disease, or unspecified chronic kidney disease: Secondary | ICD-10-CM | POA: Diagnosis not present

## 2021-05-03 DIAGNOSIS — Z7901 Long term (current) use of anticoagulants: Secondary | ICD-10-CM | POA: Diagnosis not present

## 2021-05-03 DIAGNOSIS — N189 Chronic kidney disease, unspecified: Secondary | ICD-10-CM | POA: Diagnosis not present

## 2021-05-06 DIAGNOSIS — I480 Paroxysmal atrial fibrillation: Secondary | ICD-10-CM | POA: Diagnosis not present

## 2021-05-06 DIAGNOSIS — E785 Hyperlipidemia, unspecified: Secondary | ICD-10-CM | POA: Diagnosis not present

## 2021-05-06 DIAGNOSIS — I129 Hypertensive chronic kidney disease with stage 1 through stage 4 chronic kidney disease, or unspecified chronic kidney disease: Secondary | ICD-10-CM | POA: Diagnosis not present

## 2021-05-06 DIAGNOSIS — G4733 Obstructive sleep apnea (adult) (pediatric): Secondary | ICD-10-CM | POA: Diagnosis not present

## 2021-05-06 DIAGNOSIS — N189 Chronic kidney disease, unspecified: Secondary | ICD-10-CM | POA: Diagnosis not present

## 2021-05-06 DIAGNOSIS — I213 ST elevation (STEMI) myocardial infarction of unspecified site: Secondary | ICD-10-CM | POA: Diagnosis not present

## 2021-05-06 DIAGNOSIS — R7303 Prediabetes: Secondary | ICD-10-CM | POA: Diagnosis not present

## 2021-05-06 DIAGNOSIS — Z7901 Long term (current) use of anticoagulants: Secondary | ICD-10-CM | POA: Diagnosis not present

## 2021-05-06 DIAGNOSIS — E213 Hyperparathyroidism, unspecified: Secondary | ICD-10-CM | POA: Diagnosis not present

## 2021-05-09 DIAGNOSIS — I129 Hypertensive chronic kidney disease with stage 1 through stage 4 chronic kidney disease, or unspecified chronic kidney disease: Secondary | ICD-10-CM | POA: Diagnosis not present

## 2021-05-09 DIAGNOSIS — G4733 Obstructive sleep apnea (adult) (pediatric): Secondary | ICD-10-CM | POA: Diagnosis not present

## 2021-05-09 DIAGNOSIS — Z7901 Long term (current) use of anticoagulants: Secondary | ICD-10-CM | POA: Diagnosis not present

## 2021-05-09 DIAGNOSIS — E785 Hyperlipidemia, unspecified: Secondary | ICD-10-CM | POA: Diagnosis not present

## 2021-05-09 DIAGNOSIS — I213 ST elevation (STEMI) myocardial infarction of unspecified site: Secondary | ICD-10-CM | POA: Diagnosis not present

## 2021-05-09 DIAGNOSIS — N189 Chronic kidney disease, unspecified: Secondary | ICD-10-CM | POA: Diagnosis not present

## 2021-05-09 DIAGNOSIS — I480 Paroxysmal atrial fibrillation: Secondary | ICD-10-CM | POA: Diagnosis not present

## 2021-05-09 DIAGNOSIS — R7303 Prediabetes: Secondary | ICD-10-CM | POA: Diagnosis not present

## 2021-05-09 DIAGNOSIS — E213 Hyperparathyroidism, unspecified: Secondary | ICD-10-CM | POA: Diagnosis not present

## 2021-05-10 DIAGNOSIS — I213 ST elevation (STEMI) myocardial infarction of unspecified site: Secondary | ICD-10-CM | POA: Diagnosis not present

## 2021-05-10 DIAGNOSIS — E785 Hyperlipidemia, unspecified: Secondary | ICD-10-CM | POA: Diagnosis not present

## 2021-05-10 DIAGNOSIS — I480 Paroxysmal atrial fibrillation: Secondary | ICD-10-CM | POA: Diagnosis not present

## 2021-05-10 DIAGNOSIS — R7303 Prediabetes: Secondary | ICD-10-CM | POA: Diagnosis not present

## 2021-05-10 DIAGNOSIS — Z7901 Long term (current) use of anticoagulants: Secondary | ICD-10-CM | POA: Diagnosis not present

## 2021-05-10 DIAGNOSIS — G4733 Obstructive sleep apnea (adult) (pediatric): Secondary | ICD-10-CM | POA: Diagnosis not present

## 2021-05-10 DIAGNOSIS — E213 Hyperparathyroidism, unspecified: Secondary | ICD-10-CM | POA: Diagnosis not present

## 2021-05-10 DIAGNOSIS — N189 Chronic kidney disease, unspecified: Secondary | ICD-10-CM | POA: Diagnosis not present

## 2021-05-10 DIAGNOSIS — I129 Hypertensive chronic kidney disease with stage 1 through stage 4 chronic kidney disease, or unspecified chronic kidney disease: Secondary | ICD-10-CM | POA: Diagnosis not present

## 2021-05-11 DIAGNOSIS — G4733 Obstructive sleep apnea (adult) (pediatric): Secondary | ICD-10-CM | POA: Diagnosis not present

## 2021-05-12 DIAGNOSIS — E213 Hyperparathyroidism, unspecified: Secondary | ICD-10-CM | POA: Diagnosis not present

## 2021-05-12 DIAGNOSIS — R7303 Prediabetes: Secondary | ICD-10-CM | POA: Diagnosis not present

## 2021-05-12 DIAGNOSIS — I129 Hypertensive chronic kidney disease with stage 1 through stage 4 chronic kidney disease, or unspecified chronic kidney disease: Secondary | ICD-10-CM | POA: Diagnosis not present

## 2021-05-12 DIAGNOSIS — E785 Hyperlipidemia, unspecified: Secondary | ICD-10-CM | POA: Diagnosis not present

## 2021-05-12 DIAGNOSIS — N189 Chronic kidney disease, unspecified: Secondary | ICD-10-CM | POA: Diagnosis not present

## 2021-05-12 DIAGNOSIS — G4733 Obstructive sleep apnea (adult) (pediatric): Secondary | ICD-10-CM | POA: Diagnosis not present

## 2021-05-12 DIAGNOSIS — I480 Paroxysmal atrial fibrillation: Secondary | ICD-10-CM | POA: Diagnosis not present

## 2021-05-12 DIAGNOSIS — I213 ST elevation (STEMI) myocardial infarction of unspecified site: Secondary | ICD-10-CM | POA: Diagnosis not present

## 2021-05-12 DIAGNOSIS — Z7901 Long term (current) use of anticoagulants: Secondary | ICD-10-CM | POA: Diagnosis not present

## 2021-05-13 DIAGNOSIS — I480 Paroxysmal atrial fibrillation: Secondary | ICD-10-CM | POA: Diagnosis not present

## 2021-05-13 DIAGNOSIS — N189 Chronic kidney disease, unspecified: Secondary | ICD-10-CM | POA: Diagnosis not present

## 2021-05-13 DIAGNOSIS — I129 Hypertensive chronic kidney disease with stage 1 through stage 4 chronic kidney disease, or unspecified chronic kidney disease: Secondary | ICD-10-CM | POA: Diagnosis not present

## 2021-05-13 DIAGNOSIS — Z7901 Long term (current) use of anticoagulants: Secondary | ICD-10-CM | POA: Diagnosis not present

## 2021-05-13 DIAGNOSIS — E785 Hyperlipidemia, unspecified: Secondary | ICD-10-CM | POA: Diagnosis not present

## 2021-05-13 DIAGNOSIS — G4733 Obstructive sleep apnea (adult) (pediatric): Secondary | ICD-10-CM | POA: Diagnosis not present

## 2021-05-13 DIAGNOSIS — R7303 Prediabetes: Secondary | ICD-10-CM | POA: Diagnosis not present

## 2021-05-13 DIAGNOSIS — I213 ST elevation (STEMI) myocardial infarction of unspecified site: Secondary | ICD-10-CM | POA: Diagnosis not present

## 2021-05-13 DIAGNOSIS — E213 Hyperparathyroidism, unspecified: Secondary | ICD-10-CM | POA: Diagnosis not present

## 2021-05-14 DIAGNOSIS — Z7901 Long term (current) use of anticoagulants: Secondary | ICD-10-CM | POA: Diagnosis not present

## 2021-05-14 DIAGNOSIS — R040 Epistaxis: Secondary | ICD-10-CM | POA: Diagnosis not present

## 2021-05-14 DIAGNOSIS — I129 Hypertensive chronic kidney disease with stage 1 through stage 4 chronic kidney disease, or unspecified chronic kidney disease: Secondary | ICD-10-CM | POA: Diagnosis not present

## 2021-05-14 DIAGNOSIS — E785 Hyperlipidemia, unspecified: Secondary | ICD-10-CM | POA: Diagnosis not present

## 2021-05-14 DIAGNOSIS — N183 Chronic kidney disease, stage 3 unspecified: Secondary | ICD-10-CM | POA: Diagnosis not present

## 2021-05-14 DIAGNOSIS — I4891 Unspecified atrial fibrillation: Secondary | ICD-10-CM | POA: Diagnosis not present

## 2021-05-14 DIAGNOSIS — I252 Old myocardial infarction: Secondary | ICD-10-CM | POA: Diagnosis not present

## 2021-05-16 DIAGNOSIS — I480 Paroxysmal atrial fibrillation: Secondary | ICD-10-CM | POA: Diagnosis not present

## 2021-05-16 DIAGNOSIS — E213 Hyperparathyroidism, unspecified: Secondary | ICD-10-CM | POA: Diagnosis not present

## 2021-05-16 DIAGNOSIS — E785 Hyperlipidemia, unspecified: Secondary | ICD-10-CM | POA: Diagnosis not present

## 2021-05-16 DIAGNOSIS — I213 ST elevation (STEMI) myocardial infarction of unspecified site: Secondary | ICD-10-CM | POA: Diagnosis not present

## 2021-05-16 DIAGNOSIS — G4733 Obstructive sleep apnea (adult) (pediatric): Secondary | ICD-10-CM | POA: Diagnosis not present

## 2021-05-16 DIAGNOSIS — Z7901 Long term (current) use of anticoagulants: Secondary | ICD-10-CM | POA: Diagnosis not present

## 2021-05-16 DIAGNOSIS — N189 Chronic kidney disease, unspecified: Secondary | ICD-10-CM | POA: Diagnosis not present

## 2021-05-16 DIAGNOSIS — I129 Hypertensive chronic kidney disease with stage 1 through stage 4 chronic kidney disease, or unspecified chronic kidney disease: Secondary | ICD-10-CM | POA: Diagnosis not present

## 2021-05-16 DIAGNOSIS — R7303 Prediabetes: Secondary | ICD-10-CM | POA: Diagnosis not present

## 2021-05-18 DIAGNOSIS — I2121 ST elevation (STEMI) myocardial infarction involving left circumflex coronary artery: Secondary | ICD-10-CM | POA: Diagnosis not present

## 2021-05-18 DIAGNOSIS — N289 Disorder of kidney and ureter, unspecified: Secondary | ICD-10-CM | POA: Diagnosis not present

## 2021-05-18 DIAGNOSIS — G5602 Carpal tunnel syndrome, left upper limb: Secondary | ICD-10-CM | POA: Diagnosis not present

## 2021-05-18 DIAGNOSIS — R7303 Prediabetes: Secondary | ICD-10-CM | POA: Diagnosis not present

## 2021-05-18 DIAGNOSIS — I252 Old myocardial infarction: Secondary | ICD-10-CM | POA: Diagnosis not present

## 2021-05-18 DIAGNOSIS — I480 Paroxysmal atrial fibrillation: Secondary | ICD-10-CM | POA: Diagnosis not present

## 2021-05-18 DIAGNOSIS — I119 Hypertensive heart disease without heart failure: Secondary | ICD-10-CM | POA: Diagnosis not present

## 2021-05-18 DIAGNOSIS — E782 Mixed hyperlipidemia: Secondary | ICD-10-CM | POA: Diagnosis not present

## 2021-05-18 DIAGNOSIS — I10 Essential (primary) hypertension: Secondary | ICD-10-CM | POA: Diagnosis not present

## 2021-05-18 DIAGNOSIS — E210 Primary hyperparathyroidism: Secondary | ICD-10-CM | POA: Diagnosis not present

## 2021-05-19 DIAGNOSIS — N189 Chronic kidney disease, unspecified: Secondary | ICD-10-CM | POA: Diagnosis not present

## 2021-05-19 DIAGNOSIS — I480 Paroxysmal atrial fibrillation: Secondary | ICD-10-CM | POA: Diagnosis not present

## 2021-05-19 DIAGNOSIS — E785 Hyperlipidemia, unspecified: Secondary | ICD-10-CM | POA: Diagnosis not present

## 2021-05-19 DIAGNOSIS — I129 Hypertensive chronic kidney disease with stage 1 through stage 4 chronic kidney disease, or unspecified chronic kidney disease: Secondary | ICD-10-CM | POA: Diagnosis not present

## 2021-05-19 DIAGNOSIS — R7303 Prediabetes: Secondary | ICD-10-CM | POA: Diagnosis not present

## 2021-05-19 DIAGNOSIS — Z7901 Long term (current) use of anticoagulants: Secondary | ICD-10-CM | POA: Diagnosis not present

## 2021-05-19 DIAGNOSIS — G4733 Obstructive sleep apnea (adult) (pediatric): Secondary | ICD-10-CM | POA: Diagnosis not present

## 2021-05-19 DIAGNOSIS — I213 ST elevation (STEMI) myocardial infarction of unspecified site: Secondary | ICD-10-CM | POA: Diagnosis not present

## 2021-05-19 DIAGNOSIS — E213 Hyperparathyroidism, unspecified: Secondary | ICD-10-CM | POA: Diagnosis not present

## 2021-05-20 DIAGNOSIS — I480 Paroxysmal atrial fibrillation: Secondary | ICD-10-CM | POA: Diagnosis not present

## 2021-05-20 DIAGNOSIS — E785 Hyperlipidemia, unspecified: Secondary | ICD-10-CM | POA: Diagnosis not present

## 2021-05-20 DIAGNOSIS — Z7901 Long term (current) use of anticoagulants: Secondary | ICD-10-CM | POA: Diagnosis not present

## 2021-05-20 DIAGNOSIS — E213 Hyperparathyroidism, unspecified: Secondary | ICD-10-CM | POA: Diagnosis not present

## 2021-05-20 DIAGNOSIS — M25461 Effusion, right knee: Secondary | ICD-10-CM | POA: Diagnosis not present

## 2021-05-20 DIAGNOSIS — G4733 Obstructive sleep apnea (adult) (pediatric): Secondary | ICD-10-CM | POA: Diagnosis not present

## 2021-05-20 DIAGNOSIS — M25561 Pain in right knee: Secondary | ICD-10-CM | POA: Diagnosis not present

## 2021-05-20 DIAGNOSIS — I129 Hypertensive chronic kidney disease with stage 1 through stage 4 chronic kidney disease, or unspecified chronic kidney disease: Secondary | ICD-10-CM | POA: Diagnosis not present

## 2021-05-20 DIAGNOSIS — M1711 Unilateral primary osteoarthritis, right knee: Secondary | ICD-10-CM | POA: Diagnosis not present

## 2021-05-20 DIAGNOSIS — N189 Chronic kidney disease, unspecified: Secondary | ICD-10-CM | POA: Diagnosis not present

## 2021-05-20 DIAGNOSIS — I213 ST elevation (STEMI) myocardial infarction of unspecified site: Secondary | ICD-10-CM | POA: Diagnosis not present

## 2021-05-20 DIAGNOSIS — R7303 Prediabetes: Secondary | ICD-10-CM | POA: Diagnosis not present

## 2021-05-25 DIAGNOSIS — E210 Primary hyperparathyroidism: Secondary | ICD-10-CM | POA: Diagnosis not present

## 2021-05-25 DIAGNOSIS — Z780 Asymptomatic menopausal state: Secondary | ICD-10-CM | POA: Diagnosis not present

## 2021-05-27 DIAGNOSIS — I2510 Atherosclerotic heart disease of native coronary artery without angina pectoris: Secondary | ICD-10-CM | POA: Diagnosis not present

## 2021-05-31 DIAGNOSIS — G4733 Obstructive sleep apnea (adult) (pediatric): Secondary | ICD-10-CM | POA: Diagnosis not present

## 2021-06-02 DIAGNOSIS — G4733 Obstructive sleep apnea (adult) (pediatric): Secondary | ICD-10-CM | POA: Diagnosis not present

## 2021-06-02 DIAGNOSIS — I213 ST elevation (STEMI) myocardial infarction of unspecified site: Secondary | ICD-10-CM | POA: Diagnosis not present

## 2021-06-02 DIAGNOSIS — N189 Chronic kidney disease, unspecified: Secondary | ICD-10-CM | POA: Diagnosis not present

## 2021-06-02 DIAGNOSIS — E785 Hyperlipidemia, unspecified: Secondary | ICD-10-CM | POA: Diagnosis not present

## 2021-06-02 DIAGNOSIS — R7303 Prediabetes: Secondary | ICD-10-CM | POA: Diagnosis not present

## 2021-06-02 DIAGNOSIS — I129 Hypertensive chronic kidney disease with stage 1 through stage 4 chronic kidney disease, or unspecified chronic kidney disease: Secondary | ICD-10-CM | POA: Diagnosis not present

## 2021-06-02 DIAGNOSIS — E213 Hyperparathyroidism, unspecified: Secondary | ICD-10-CM | POA: Diagnosis not present

## 2021-06-02 DIAGNOSIS — Z7901 Long term (current) use of anticoagulants: Secondary | ICD-10-CM | POA: Diagnosis not present

## 2021-06-02 DIAGNOSIS — I480 Paroxysmal atrial fibrillation: Secondary | ICD-10-CM | POA: Diagnosis not present

## 2021-06-09 DIAGNOSIS — G4733 Obstructive sleep apnea (adult) (pediatric): Secondary | ICD-10-CM | POA: Diagnosis not present

## 2021-06-29 DIAGNOSIS — Z20822 Contact with and (suspected) exposure to covid-19: Secondary | ICD-10-CM | POA: Diagnosis not present

## 2021-07-08 DIAGNOSIS — Z1152 Encounter for screening for COVID-19: Secondary | ICD-10-CM | POA: Diagnosis not present

## 2021-07-08 DIAGNOSIS — Z20822 Contact with and (suspected) exposure to covid-19: Secondary | ICD-10-CM | POA: Diagnosis not present

## 2021-07-08 DIAGNOSIS — R0981 Nasal congestion: Secondary | ICD-10-CM | POA: Diagnosis not present

## 2021-07-09 DIAGNOSIS — Z20822 Contact with and (suspected) exposure to covid-19: Secondary | ICD-10-CM | POA: Diagnosis not present

## 2021-07-13 DIAGNOSIS — I2121 ST elevation (STEMI) myocardial infarction involving left circumflex coronary artery: Secondary | ICD-10-CM | POA: Diagnosis not present

## 2021-07-13 DIAGNOSIS — E210 Primary hyperparathyroidism: Secondary | ICD-10-CM | POA: Diagnosis not present

## 2021-07-13 DIAGNOSIS — I480 Paroxysmal atrial fibrillation: Secondary | ICD-10-CM | POA: Diagnosis not present

## 2021-07-13 DIAGNOSIS — N289 Disorder of kidney and ureter, unspecified: Secondary | ICD-10-CM | POA: Diagnosis not present

## 2021-07-13 DIAGNOSIS — I10 Essential (primary) hypertension: Secondary | ICD-10-CM | POA: Diagnosis not present

## 2021-07-13 DIAGNOSIS — R7303 Prediabetes: Secondary | ICD-10-CM | POA: Diagnosis not present

## 2021-07-13 DIAGNOSIS — E782 Mixed hyperlipidemia: Secondary | ICD-10-CM | POA: Diagnosis not present

## 2021-07-13 DIAGNOSIS — G4733 Obstructive sleep apnea (adult) (pediatric): Secondary | ICD-10-CM | POA: Diagnosis not present

## 2021-07-13 DIAGNOSIS — I119 Hypertensive heart disease without heart failure: Secondary | ICD-10-CM | POA: Diagnosis not present

## 2021-07-18 DIAGNOSIS — I130 Hypertensive heart and chronic kidney disease with heart failure and stage 1 through stage 4 chronic kidney disease, or unspecified chronic kidney disease: Secondary | ICD-10-CM | POA: Diagnosis not present

## 2021-07-18 DIAGNOSIS — G629 Polyneuropathy, unspecified: Secondary | ICD-10-CM | POA: Diagnosis not present

## 2021-07-18 DIAGNOSIS — G459 Transient cerebral ischemic attack, unspecified: Secondary | ICD-10-CM | POA: Diagnosis not present

## 2021-07-18 DIAGNOSIS — R001 Bradycardia, unspecified: Secondary | ICD-10-CM | POA: Diagnosis not present

## 2021-07-18 DIAGNOSIS — I2109 ST elevation (STEMI) myocardial infarction involving other coronary artery of anterior wall: Secondary | ICD-10-CM | POA: Diagnosis not present

## 2021-07-18 DIAGNOSIS — E7800 Pure hypercholesterolemia, unspecified: Secondary | ICD-10-CM | POA: Diagnosis not present

## 2021-07-18 DIAGNOSIS — D320 Benign neoplasm of cerebral meninges: Secondary | ICD-10-CM | POA: Diagnosis not present

## 2021-07-18 DIAGNOSIS — I6782 Cerebral ischemia: Secondary | ICD-10-CM | POA: Diagnosis not present

## 2021-07-18 DIAGNOSIS — M6281 Muscle weakness (generalized): Secondary | ICD-10-CM | POA: Diagnosis not present

## 2021-07-18 DIAGNOSIS — R778 Other specified abnormalities of plasma proteins: Secondary | ICD-10-CM | POA: Diagnosis not present

## 2021-07-18 DIAGNOSIS — R202 Paresthesia of skin: Secondary | ICD-10-CM | POA: Diagnosis not present

## 2021-07-18 DIAGNOSIS — E669 Obesity, unspecified: Secondary | ICD-10-CM | POA: Diagnosis not present

## 2021-07-18 DIAGNOSIS — D696 Thrombocytopenia, unspecified: Secondary | ICD-10-CM | POA: Diagnosis not present

## 2021-07-18 DIAGNOSIS — I2129 ST elevation (STEMI) myocardial infarction involving other sites: Secondary | ICD-10-CM | POA: Diagnosis not present

## 2021-07-18 DIAGNOSIS — I10 Essential (primary) hypertension: Secondary | ICD-10-CM | POA: Diagnosis not present

## 2021-07-18 DIAGNOSIS — I482 Chronic atrial fibrillation, unspecified: Secondary | ICD-10-CM | POA: Diagnosis not present

## 2021-07-18 DIAGNOSIS — G588 Other specified mononeuropathies: Secondary | ICD-10-CM | POA: Diagnosis not present

## 2021-07-18 DIAGNOSIS — R200 Anesthesia of skin: Secondary | ICD-10-CM | POA: Diagnosis not present

## 2021-07-18 DIAGNOSIS — I509 Heart failure, unspecified: Secondary | ICD-10-CM | POA: Diagnosis not present

## 2021-07-18 DIAGNOSIS — I639 Cerebral infarction, unspecified: Secondary | ICD-10-CM | POA: Diagnosis not present

## 2021-07-18 DIAGNOSIS — I4891 Unspecified atrial fibrillation: Secondary | ICD-10-CM | POA: Diagnosis not present

## 2021-07-18 DIAGNOSIS — R29898 Other symptoms and signs involving the musculoskeletal system: Secondary | ICD-10-CM | POA: Diagnosis not present

## 2021-07-18 DIAGNOSIS — N183 Chronic kidney disease, stage 3 unspecified: Secondary | ICD-10-CM | POA: Diagnosis not present

## 2021-07-18 DIAGNOSIS — Z7901 Long term (current) use of anticoagulants: Secondary | ICD-10-CM | POA: Diagnosis not present

## 2021-07-18 DIAGNOSIS — E876 Hypokalemia: Secondary | ICD-10-CM | POA: Diagnosis not present

## 2021-07-18 DIAGNOSIS — R9431 Abnormal electrocardiogram [ECG] [EKG]: Secondary | ICD-10-CM | POA: Diagnosis not present

## 2021-07-18 DIAGNOSIS — R930 Abnormal findings on diagnostic imaging of skull and head, not elsewhere classified: Secondary | ICD-10-CM | POA: Diagnosis not present

## 2021-07-19 DIAGNOSIS — D320 Benign neoplasm of cerebral meninges: Secondary | ICD-10-CM | POA: Diagnosis not present

## 2021-07-19 DIAGNOSIS — I6782 Cerebral ischemia: Secondary | ICD-10-CM | POA: Diagnosis not present

## 2021-07-19 DIAGNOSIS — R202 Paresthesia of skin: Secondary | ICD-10-CM | POA: Diagnosis not present

## 2021-07-19 DIAGNOSIS — I639 Cerebral infarction, unspecified: Secondary | ICD-10-CM | POA: Diagnosis not present

## 2021-07-20 DIAGNOSIS — G4733 Obstructive sleep apnea (adult) (pediatric): Secondary | ICD-10-CM | POA: Diagnosis not present

## 2021-08-19 DIAGNOSIS — G4733 Obstructive sleep apnea (adult) (pediatric): Secondary | ICD-10-CM | POA: Diagnosis not present

## 2021-08-26 DIAGNOSIS — E782 Mixed hyperlipidemia: Secondary | ICD-10-CM | POA: Diagnosis not present

## 2021-08-26 DIAGNOSIS — R29898 Other symptoms and signs involving the musculoskeletal system: Secondary | ICD-10-CM | POA: Diagnosis not present

## 2021-08-26 DIAGNOSIS — I119 Hypertensive heart disease without heart failure: Secondary | ICD-10-CM | POA: Diagnosis not present

## 2021-08-26 DIAGNOSIS — E559 Vitamin D deficiency, unspecified: Secondary | ICD-10-CM | POA: Diagnosis not present

## 2021-08-26 DIAGNOSIS — N183 Chronic kidney disease, stage 3 unspecified: Secondary | ICD-10-CM | POA: Diagnosis not present

## 2021-09-06 DIAGNOSIS — I2121 ST elevation (STEMI) myocardial infarction involving left circumflex coronary artery: Secondary | ICD-10-CM | POA: Diagnosis not present

## 2021-09-06 DIAGNOSIS — I480 Paroxysmal atrial fibrillation: Secondary | ICD-10-CM | POA: Diagnosis not present

## 2021-09-12 DIAGNOSIS — N281 Cyst of kidney, acquired: Secondary | ICD-10-CM | POA: Diagnosis not present

## 2021-09-12 DIAGNOSIS — N183 Chronic kidney disease, stage 3 unspecified: Secondary | ICD-10-CM | POA: Diagnosis not present

## 2021-09-19 DIAGNOSIS — G4733 Obstructive sleep apnea (adult) (pediatric): Secondary | ICD-10-CM | POA: Diagnosis not present

## 2021-09-20 DIAGNOSIS — R200 Anesthesia of skin: Secondary | ICD-10-CM | POA: Diagnosis not present

## 2021-09-21 DIAGNOSIS — N183 Chronic kidney disease, stage 3 unspecified: Secondary | ICD-10-CM | POA: Diagnosis not present

## 2021-09-21 DIAGNOSIS — N179 Acute kidney failure, unspecified: Secondary | ICD-10-CM | POA: Diagnosis not present

## 2021-09-21 DIAGNOSIS — R200 Anesthesia of skin: Secondary | ICD-10-CM | POA: Diagnosis not present

## 2021-09-21 DIAGNOSIS — R29898 Other symptoms and signs involving the musculoskeletal system: Secondary | ICD-10-CM | POA: Diagnosis not present

## 2021-09-21 DIAGNOSIS — I10 Essential (primary) hypertension: Secondary | ICD-10-CM | POA: Diagnosis not present

## 2021-09-21 DIAGNOSIS — N281 Cyst of kidney, acquired: Secondary | ICD-10-CM | POA: Diagnosis not present

## 2021-09-21 DIAGNOSIS — G5603 Carpal tunnel syndrome, bilateral upper limbs: Secondary | ICD-10-CM | POA: Diagnosis not present

## 2021-09-26 DIAGNOSIS — D320 Benign neoplasm of cerebral meninges: Secondary | ICD-10-CM | POA: Diagnosis not present

## 2021-09-26 DIAGNOSIS — D443 Neoplasm of uncertain behavior of pituitary gland: Secondary | ICD-10-CM | POA: Diagnosis not present

## 2021-09-27 DIAGNOSIS — G4733 Obstructive sleep apnea (adult) (pediatric): Secondary | ICD-10-CM | POA: Diagnosis not present

## 2021-09-27 DIAGNOSIS — R29898 Other symptoms and signs involving the musculoskeletal system: Secondary | ICD-10-CM | POA: Diagnosis not present

## 2021-09-27 DIAGNOSIS — E663 Overweight: Secondary | ICD-10-CM | POA: Diagnosis not present

## 2021-09-28 DIAGNOSIS — D352 Benign neoplasm of pituitary gland: Secondary | ICD-10-CM | POA: Diagnosis not present

## 2021-09-28 DIAGNOSIS — I2510 Atherosclerotic heart disease of native coronary artery without angina pectoris: Secondary | ICD-10-CM | POA: Diagnosis not present

## 2021-09-28 DIAGNOSIS — E559 Vitamin D deficiency, unspecified: Secondary | ICD-10-CM | POA: Diagnosis not present

## 2021-09-28 DIAGNOSIS — E210 Primary hyperparathyroidism: Secondary | ICD-10-CM | POA: Diagnosis not present

## 2021-09-29 DIAGNOSIS — M5012 Mid-cervical disc disorder, unspecified level: Secondary | ICD-10-CM | POA: Diagnosis not present

## 2021-09-29 DIAGNOSIS — M50121 Cervical disc disorder at C4-C5 level with radiculopathy: Secondary | ICD-10-CM | POA: Diagnosis not present

## 2021-09-29 DIAGNOSIS — M50122 Cervical disc disorder at C5-C6 level with radiculopathy: Secondary | ICD-10-CM | POA: Diagnosis not present

## 2021-09-29 DIAGNOSIS — M5011 Cervical disc disorder with radiculopathy,  high cervical region: Secondary | ICD-10-CM | POA: Diagnosis not present

## 2021-09-29 DIAGNOSIS — M4311 Spondylolisthesis, occipito-atlanto-axial region: Secondary | ICD-10-CM | POA: Diagnosis not present

## 2021-09-29 DIAGNOSIS — M4722 Other spondylosis with radiculopathy, cervical region: Secondary | ICD-10-CM | POA: Diagnosis not present

## 2021-09-29 DIAGNOSIS — M50123 Cervical disc disorder at C6-C7 level with radiculopathy: Secondary | ICD-10-CM | POA: Diagnosis not present

## 2021-09-29 DIAGNOSIS — M5013 Cervical disc disorder with radiculopathy, cervicothoracic region: Secondary | ICD-10-CM | POA: Diagnosis not present

## 2021-09-29 DIAGNOSIS — M4312 Spondylolisthesis, cervical region: Secondary | ICD-10-CM | POA: Diagnosis not present

## 2021-10-03 DIAGNOSIS — R29898 Other symptoms and signs involving the musculoskeletal system: Secondary | ICD-10-CM | POA: Diagnosis not present

## 2021-10-05 DIAGNOSIS — E210 Primary hyperparathyroidism: Secondary | ICD-10-CM | POA: Diagnosis not present

## 2021-10-05 DIAGNOSIS — D352 Benign neoplasm of pituitary gland: Secondary | ICD-10-CM | POA: Diagnosis not present

## 2021-10-06 DIAGNOSIS — H5213 Myopia, bilateral: Secondary | ICD-10-CM | POA: Diagnosis not present

## 2021-10-06 DIAGNOSIS — H524 Presbyopia: Secondary | ICD-10-CM | POA: Diagnosis not present

## 2021-10-06 DIAGNOSIS — H25813 Combined forms of age-related cataract, bilateral: Secondary | ICD-10-CM | POA: Diagnosis not present

## 2021-10-06 DIAGNOSIS — H52203 Unspecified astigmatism, bilateral: Secondary | ICD-10-CM | POA: Diagnosis not present

## 2021-10-10 DIAGNOSIS — I252 Old myocardial infarction: Secondary | ICD-10-CM | POA: Diagnosis not present

## 2021-10-10 DIAGNOSIS — I10 Essential (primary) hypertension: Secondary | ICD-10-CM | POA: Diagnosis not present

## 2021-10-10 DIAGNOSIS — M25551 Pain in right hip: Secondary | ICD-10-CM | POA: Diagnosis not present

## 2021-10-10 DIAGNOSIS — R7303 Prediabetes: Secondary | ICD-10-CM | POA: Diagnosis not present

## 2021-10-10 DIAGNOSIS — I119 Hypertensive heart disease without heart failure: Secondary | ICD-10-CM | POA: Diagnosis not present

## 2021-10-10 DIAGNOSIS — I5032 Chronic diastolic (congestive) heart failure: Secondary | ICD-10-CM | POA: Diagnosis not present

## 2021-10-10 DIAGNOSIS — E210 Primary hyperparathyroidism: Secondary | ICD-10-CM | POA: Diagnosis not present

## 2021-10-10 DIAGNOSIS — E782 Mixed hyperlipidemia: Secondary | ICD-10-CM | POA: Diagnosis not present

## 2021-10-10 DIAGNOSIS — I480 Paroxysmal atrial fibrillation: Secondary | ICD-10-CM | POA: Diagnosis not present

## 2021-10-10 DIAGNOSIS — N289 Disorder of kidney and ureter, unspecified: Secondary | ICD-10-CM | POA: Diagnosis not present

## 2021-10-12 DIAGNOSIS — N179 Acute kidney failure, unspecified: Secondary | ICD-10-CM | POA: Diagnosis not present

## 2021-10-12 DIAGNOSIS — I5023 Acute on chronic systolic (congestive) heart failure: Secondary | ICD-10-CM | POA: Diagnosis not present

## 2021-10-12 DIAGNOSIS — D72829 Elevated white blood cell count, unspecified: Secondary | ICD-10-CM | POA: Diagnosis not present

## 2021-10-12 DIAGNOSIS — J9621 Acute and chronic respiratory failure with hypoxia: Secondary | ICD-10-CM | POA: Diagnosis not present

## 2021-10-12 DIAGNOSIS — S7011XA Contusion of right thigh, initial encounter: Secondary | ICD-10-CM | POA: Diagnosis not present

## 2021-10-12 DIAGNOSIS — I314 Cardiac tamponade: Secondary | ICD-10-CM | POA: Diagnosis not present

## 2021-10-12 DIAGNOSIS — S1083XA Contusion of other specified part of neck, initial encounter: Secondary | ICD-10-CM | POA: Diagnosis not present

## 2021-10-12 DIAGNOSIS — R579 Shock, unspecified: Secondary | ICD-10-CM | POA: Diagnosis not present

## 2021-10-12 DIAGNOSIS — M1611 Unilateral primary osteoarthritis, right hip: Secondary | ICD-10-CM | POA: Diagnosis not present

## 2021-10-12 DIAGNOSIS — R930 Abnormal findings on diagnostic imaging of skull and head, not elsewhere classified: Secondary | ICD-10-CM | POA: Diagnosis not present

## 2021-10-12 DIAGNOSIS — J9859 Other diseases of mediastinum, not elsewhere classified: Secondary | ICD-10-CM | POA: Diagnosis not present

## 2021-10-12 DIAGNOSIS — N281 Cyst of kidney, acquired: Secondary | ICD-10-CM | POA: Diagnosis not present

## 2021-10-12 DIAGNOSIS — Z4659 Encounter for fitting and adjustment of other gastrointestinal appliance and device: Secondary | ICD-10-CM | POA: Diagnosis not present

## 2021-10-12 DIAGNOSIS — D259 Leiomyoma of uterus, unspecified: Secondary | ICD-10-CM | POA: Diagnosis not present

## 2021-10-12 DIAGNOSIS — R9431 Abnormal electrocardiogram [ECG] [EKG]: Secondary | ICD-10-CM | POA: Diagnosis not present

## 2021-10-12 DIAGNOSIS — E872 Acidosis, unspecified: Secondary | ICD-10-CM | POA: Diagnosis not present

## 2021-10-12 DIAGNOSIS — M6282 Rhabdomyolysis: Secondary | ICD-10-CM | POA: Diagnosis not present

## 2021-10-12 DIAGNOSIS — G9341 Metabolic encephalopathy: Secondary | ICD-10-CM | POA: Diagnosis not present

## 2021-10-12 DIAGNOSIS — R7401 Elevation of levels of liver transaminase levels: Secondary | ICD-10-CM | POA: Diagnosis not present

## 2021-10-12 DIAGNOSIS — R7881 Bacteremia: Secondary | ICD-10-CM | POA: Diagnosis not present

## 2021-10-12 DIAGNOSIS — R188 Other ascites: Secondary | ICD-10-CM | POA: Diagnosis not present

## 2021-10-12 DIAGNOSIS — I2109 ST elevation (STEMI) myocardial infarction involving other coronary artery of anterior wall: Secondary | ICD-10-CM | POA: Diagnosis not present

## 2021-10-12 DIAGNOSIS — I5189 Other ill-defined heart diseases: Secondary | ICD-10-CM | POA: Diagnosis not present

## 2021-10-12 DIAGNOSIS — I959 Hypotension, unspecified: Secondary | ICD-10-CM | POA: Diagnosis not present

## 2021-10-12 DIAGNOSIS — K5641 Fecal impaction: Secondary | ICD-10-CM | POA: Diagnosis not present

## 2021-10-12 DIAGNOSIS — I3481 Nonrheumatic mitral (valve) annulus calcification: Secondary | ICD-10-CM | POA: Diagnosis not present

## 2021-10-12 DIAGNOSIS — K767 Hepatorenal syndrome: Secondary | ICD-10-CM | POA: Diagnosis not present

## 2021-10-12 DIAGNOSIS — I358 Other nonrheumatic aortic valve disorders: Secondary | ICD-10-CM | POA: Diagnosis not present

## 2021-10-12 DIAGNOSIS — N189 Chronic kidney disease, unspecified: Secondary | ICD-10-CM | POA: Diagnosis not present

## 2021-10-12 DIAGNOSIS — I6381 Other cerebral infarction due to occlusion or stenosis of small artery: Secondary | ICD-10-CM | POA: Diagnosis not present

## 2021-10-12 DIAGNOSIS — I3489 Other nonrheumatic mitral valve disorders: Secondary | ICD-10-CM | POA: Diagnosis not present

## 2021-10-12 DIAGNOSIS — I4891 Unspecified atrial fibrillation: Secondary | ICD-10-CM | POA: Diagnosis not present

## 2021-10-12 DIAGNOSIS — I517 Cardiomegaly: Secondary | ICD-10-CM | POA: Diagnosis not present

## 2021-10-12 DIAGNOSIS — K7200 Acute and subacute hepatic failure without coma: Secondary | ICD-10-CM | POA: Diagnosis not present

## 2021-10-12 DIAGNOSIS — E785 Hyperlipidemia, unspecified: Secondary | ICD-10-CM | POA: Diagnosis not present

## 2021-10-12 DIAGNOSIS — R531 Weakness: Secondary | ICD-10-CM | POA: Diagnosis not present

## 2021-10-12 DIAGNOSIS — Z0389 Encounter for observation for other suspected diseases and conditions ruled out: Secondary | ICD-10-CM | POA: Diagnosis not present

## 2021-10-12 DIAGNOSIS — I3139 Other pericardial effusion (noninflammatory): Secondary | ICD-10-CM | POA: Diagnosis not present

## 2021-10-12 DIAGNOSIS — I10 Essential (primary) hypertension: Secondary | ICD-10-CM | POA: Diagnosis not present

## 2021-10-12 DIAGNOSIS — K719 Toxic liver disease, unspecified: Secondary | ICD-10-CM | POA: Diagnosis not present

## 2021-10-12 DIAGNOSIS — R931 Abnormal findings on diagnostic imaging of heart and coronary circulation: Secondary | ICD-10-CM | POA: Diagnosis not present

## 2021-10-12 DIAGNOSIS — I340 Nonrheumatic mitral (valve) insufficiency: Secondary | ICD-10-CM | POA: Diagnosis not present

## 2021-10-12 DIAGNOSIS — K449 Diaphragmatic hernia without obstruction or gangrene: Secondary | ICD-10-CM | POA: Diagnosis not present

## 2021-10-12 DIAGNOSIS — Z4682 Encounter for fitting and adjustment of non-vascular catheter: Secondary | ICD-10-CM | POA: Diagnosis not present

## 2021-10-12 DIAGNOSIS — I480 Paroxysmal atrial fibrillation: Secondary | ICD-10-CM | POA: Diagnosis not present

## 2021-10-12 DIAGNOSIS — D65 Disseminated intravascular coagulation [defibrination syndrome]: Secondary | ICD-10-CM | POA: Diagnosis not present

## 2021-10-12 DIAGNOSIS — K5730 Diverticulosis of large intestine without perforation or abscess without bleeding: Secondary | ICD-10-CM | POA: Diagnosis not present

## 2021-10-12 DIAGNOSIS — I493 Ventricular premature depolarization: Secondary | ICD-10-CM | POA: Diagnosis not present

## 2021-10-12 DIAGNOSIS — N183 Chronic kidney disease, stage 3 unspecified: Secondary | ICD-10-CM | POA: Diagnosis not present

## 2021-10-12 DIAGNOSIS — Z452 Encounter for adjustment and management of vascular access device: Secondary | ICD-10-CM | POA: Diagnosis not present

## 2021-10-12 DIAGNOSIS — N170 Acute kidney failure with tubular necrosis: Secondary | ICD-10-CM | POA: Diagnosis not present

## 2021-10-12 DIAGNOSIS — J90 Pleural effusion, not elsewhere classified: Secondary | ICD-10-CM | POA: Diagnosis not present

## 2021-10-12 DIAGNOSIS — W1839XA Other fall on same level, initial encounter: Secondary | ICD-10-CM | POA: Diagnosis not present

## 2021-10-12 DIAGNOSIS — R6521 Severe sepsis with septic shock: Secondary | ICD-10-CM | POA: Diagnosis not present

## 2021-10-12 DIAGNOSIS — Z7901 Long term (current) use of anticoagulants: Secondary | ICD-10-CM | POA: Diagnosis not present

## 2021-10-12 DIAGNOSIS — G936 Cerebral edema: Secondary | ICD-10-CM | POA: Diagnosis not present

## 2021-10-12 DIAGNOSIS — K828 Other specified diseases of gallbladder: Secondary | ICD-10-CM | POA: Diagnosis not present

## 2021-10-12 DIAGNOSIS — R112 Nausea with vomiting, unspecified: Secondary | ICD-10-CM | POA: Diagnosis not present

## 2021-10-12 DIAGNOSIS — A4159 Other Gram-negative sepsis: Secondary | ICD-10-CM | POA: Diagnosis not present

## 2021-10-12 DIAGNOSIS — B179 Acute viral hepatitis, unspecified: Secondary | ICD-10-CM | POA: Diagnosis not present

## 2021-10-20 DIAGNOSIS — G4733 Obstructive sleep apnea (adult) (pediatric): Secondary | ICD-10-CM | POA: Diagnosis not present
